# Patient Record
Sex: Female | Born: 1955
Health system: Southern US, Community
[De-identification: ages and names within clinical notes are randomized; demographics above are authoritative.]

## PROBLEM LIST (undated history)

## (undated) DIAGNOSIS — T7840XA Allergy, unspecified, initial encounter: Secondary | ICD-10-CM

## (undated) DIAGNOSIS — M79604 Pain in right leg: Secondary | ICD-10-CM

## (undated) DIAGNOSIS — E669 Obesity, unspecified: Secondary | ICD-10-CM

## (undated) DIAGNOSIS — M1990 Unspecified osteoarthritis, unspecified site: Secondary | ICD-10-CM

## (undated) DIAGNOSIS — E7800 Pure hypercholesterolemia, unspecified: Secondary | ICD-10-CM

## (undated) DIAGNOSIS — R079 Chest pain, unspecified: Secondary | ICD-10-CM

## (undated) DIAGNOSIS — G459 Transient cerebral ischemic attack, unspecified: Secondary | ICD-10-CM

## (undated) DIAGNOSIS — G4733 Obstructive sleep apnea (adult) (pediatric): Secondary | ICD-10-CM

## (undated) DIAGNOSIS — K589 Irritable bowel syndrome without diarrhea: Secondary | ICD-10-CM

## (undated) DIAGNOSIS — F32A Depression, unspecified: Secondary | ICD-10-CM

## (undated) DIAGNOSIS — R059 Cough, unspecified: Secondary | ICD-10-CM

## (undated) DIAGNOSIS — J301 Allergic rhinitis due to pollen: Secondary | ICD-10-CM

## (undated) DIAGNOSIS — K219 Gastro-esophageal reflux disease without esophagitis: Secondary | ICD-10-CM

## (undated) DIAGNOSIS — E785 Hyperlipidemia, unspecified: Secondary | ICD-10-CM

## (undated) HISTORY — DX: Allergy, unspecified, initial encounter: T78.40XA

## (undated) HISTORY — DX: Cough, unspecified: R05.9

## (undated) HISTORY — DX: Pain in right leg: M79.604

## (undated) HISTORY — DX: Obstructive sleep apnea (adult) (pediatric): G47.33

## (undated) HISTORY — DX: Chest pain, unspecified: R07.9

## (undated) HISTORY — DX: Unspecified osteoarthritis, unspecified site: M19.90

## (undated) HISTORY — DX: Transient cerebral ischemic attack, unspecified: G45.9

## (undated) HISTORY — DX: Pure hypercholesterolemia, unspecified: E78.00

## (undated) HISTORY — DX: Gastro-esophageal reflux disease without esophagitis: K21.9

## (undated) HISTORY — DX: Obesity, unspecified: E66.9

## (undated) HISTORY — PX: CHOLECYSTECTOMY: SHX55

## (undated) HISTORY — PX: FOOT SURGERY: SHX648

## (undated) HISTORY — DX: Irritable bowel syndrome, unspecified: K58.9

## (undated) HISTORY — DX: Hyperlipidemia, unspecified: E78.5

## (undated) HISTORY — PX: DILATION AND CURETTAGE OF UTERUS: SHX78

## (undated) HISTORY — DX: Depression, unspecified: F32.A

## (undated) HISTORY — DX: Allergic rhinitis due to pollen: J30.1

---

## 2000-02-26 ENCOUNTER — Other Ambulatory Visit: Admission: RE | Admit: 2000-02-26 | Discharge: 2000-02-26 | Payer: Self-pay | Admitting: Internal Medicine

## 2000-03-06 ENCOUNTER — Encounter: Admission: RE | Admit: 2000-03-06 | Discharge: 2000-03-06 | Payer: Self-pay | Admitting: Internal Medicine

## 2001-05-31 ENCOUNTER — Other Ambulatory Visit: Admission: RE | Admit: 2001-05-31 | Discharge: 2001-05-31 | Payer: Self-pay | Admitting: Internal Medicine

## 2002-07-12 ENCOUNTER — Encounter: Admission: RE | Admit: 2002-07-12 | Discharge: 2002-07-12 | Payer: Self-pay | Admitting: Internal Medicine

## 2002-07-12 ENCOUNTER — Encounter: Payer: Self-pay | Admitting: Internal Medicine

## 2003-01-31 ENCOUNTER — Encounter: Payer: Self-pay | Admitting: Internal Medicine

## 2003-01-31 ENCOUNTER — Encounter: Admission: RE | Admit: 2003-01-31 | Discharge: 2003-01-31 | Payer: Self-pay | Admitting: Internal Medicine

## 2005-03-25 ENCOUNTER — Other Ambulatory Visit: Admission: RE | Admit: 2005-03-25 | Discharge: 2005-03-25 | Payer: Self-pay | Admitting: Internal Medicine

## 2005-03-28 ENCOUNTER — Encounter: Admission: RE | Admit: 2005-03-28 | Discharge: 2005-03-28 | Payer: Self-pay | Admitting: Internal Medicine

## 2005-11-24 ENCOUNTER — Encounter: Admission: RE | Admit: 2005-11-24 | Discharge: 2005-11-24 | Payer: Self-pay | Admitting: Internal Medicine

## 2005-12-25 ENCOUNTER — Encounter (INDEPENDENT_AMBULATORY_CARE_PROVIDER_SITE_OTHER): Payer: Self-pay | Admitting: Specialist

## 2005-12-25 ENCOUNTER — Ambulatory Visit (HOSPITAL_COMMUNITY): Admission: RE | Admit: 2005-12-25 | Discharge: 2005-12-25 | Payer: Self-pay | Admitting: Surgery

## 2006-03-30 ENCOUNTER — Other Ambulatory Visit: Admission: RE | Admit: 2006-03-30 | Discharge: 2006-03-30 | Payer: Self-pay | Admitting: *Deleted

## 2008-09-20 ENCOUNTER — Other Ambulatory Visit: Admission: RE | Admit: 2008-09-20 | Discharge: 2008-09-20 | Payer: Self-pay | Admitting: Internal Medicine

## 2008-11-02 ENCOUNTER — Encounter (INDEPENDENT_AMBULATORY_CARE_PROVIDER_SITE_OTHER): Payer: Self-pay | Admitting: *Deleted

## 2008-11-02 ENCOUNTER — Ambulatory Visit (HOSPITAL_COMMUNITY): Admission: RE | Admit: 2008-11-02 | Discharge: 2008-11-02 | Payer: Self-pay | Admitting: *Deleted

## 2009-09-26 ENCOUNTER — Other Ambulatory Visit: Admission: RE | Admit: 2009-09-26 | Discharge: 2009-09-26 | Payer: Self-pay | Admitting: Internal Medicine

## 2009-10-01 ENCOUNTER — Encounter: Admission: RE | Admit: 2009-10-01 | Discharge: 2009-10-01 | Payer: Self-pay | Admitting: Internal Medicine

## 2010-10-15 NOTE — Op Note (Signed)
NAMEDAVETTE, NUGENT            ACCOUNT NO.:  [id number]   MEDICAL RECORD NO.:  [id number]          PATIENT TYPE:  AMB   LOCATION:  ENDO                         FACILITY:  [facility]   PHYSICIAN:  Georgiana Spinner, M.D.    DATE OF BIRTH:  [birth year]   DATE OF PROCEDURE:  11/02/2008  DATE OF DISCHARGE:                               OPERATIVE REPORT   PROCEDURE:  Upper endoscopy.   INDICATIONS:  Gastroesophageal reflux disease.   ANESTHESIA:  Fentanyl 75 mcg, Versed 7 mg.   PROCEDURE:  With the patient mildly sedated in the left lateral  decubitus position, the Pentax videoscopic endoscope was inserted in the  mouth, passed under direct vision through the esophagus -- which  appeared normal.  There was no evidence of Barrett's esophagus.  We  entered into the stomach; fundus, body, antrum, duodenal bulb, second  portion of duodenum were visualized.  From this point the endoscope was  slowly withdrawn, taking circumferential views of duodenal mucosa until  the endoscope had been pulled back into the stomach; placed in  retroflexion to view the stomach from below.  The endoscope was  straightened and withdrawn, taking circumferential views of the  remaining gastric and esophageal mucosa.  The patient's vital signs,  pulse oximetry remained stable.  The patient tolerated the procedure  well without apparent complication.   FINDINGS:  Unremarkable endoscopic examination.   PLAN:  Proceed to colonoscopy.           ______________________________  Georgiana Spinner, M.D.     GMO/MEDQ  D:  11/02/2008  T:  11/02/2008  Job:  [id number]

## 2010-10-15 NOTE — Op Note (Signed)
[name], [name]            ACCOUNT NO.:  [id number]   MEDICAL RECORD NO.:  [id number]          PATIENT TYPE:  AMB   LOCATION:  ENDO                         FACILITY:  [facility]   PHYSICIAN:  Georgiana Spinner, M.D.    DATE OF BIRTH:  [birth year]   DATE OF PROCEDURE:  11/02/2008  DATE OF DISCHARGE:                               OPERATIVE REPORT   PROCEDURE:  Colonoscopy.   INDICATIONS:  Colon cancer screening.   ANESTHESIA:  Fentanyl 50 mcg, Versed 3 mg.   PROCEDURE:  With the patient mildly sedated in the left lateral  decubitus position, the Pentax videoscopic pediatric colonoscope was  inserted in the rectum and passed under direct vision with pressure  applied, the patient turned to her back and subsequently back to her  left side.  We were able to reach the cecum, identified by ileocecal  valve and appendiceal orifice.  At the appendiceal orifice, there was a  question of whether this was adenomatous tissue, so I did take cold  biopsy of it.  My suspicion is it probably a normal variant, but once we  took biopsies, the endoscope was withdrawn, taking circumferential views  of colonic mucosa, stopping only in the rectum which appeared normal on  direct and showed hemorrhoids on retroflexed view.  The endoscope was  straightened and withdrawn.  The patient's vital signs and pulse  oximeter remained stable.  The patient tolerated the procedure well  without apparent complications.   FINDINGS:  Question of polyp at cecum, biopsied.  Await biopsy report.  The patient will call me for results and follow-up with me as an  outpatient.           ______________________________  Georgiana Spinner, M.D.     GMO/MEDQ  D:  11/02/2008  T:  11/02/2008  Job:  [id number]

## 2010-10-18 NOTE — Op Note (Signed)
Whitney [name], Whitney [name]            ACCOUNT NO.:  [id number]   MEDICAL RECORD NO.:  [id number]          PATIENT TYPE:  AMB   LOCATION:  DAY                          FACILITY:  [facility]   PHYSICIAN:  Currie Paris, M.D.DATE OF BIRTH:  10/27/1955   DATE OF PROCEDURE:  12/25/2005  DATE OF DISCHARGE:                                 OPERATIVE REPORT   CCS   PREOPERATIVE DIAGNOSIS:  Chronic calculus cholecystitis.   POSTOPERATIVE DIAGNOSIS:  Chronic calculus cholecystitis.   OPERATION:  Laparoscopic cholecystectomy with operative cholangiogram.   SURGEON:  Currie Paris, M.D.   ASSISTANT:  Sheppard Plumber. Earlene Plater, M.D.   ANESTHESIA:  General endotracheal.   CLINICAL HISTORY:  This is a 55 year old lady with gallbladder with multiple  stones and biliary type symptoms.  She elected to proceed to  cholecystectomy.   DESCRIPTION OF PROCEDURE:  The patient was seen in the holding area and she  had no further questions.  She confirmed that cholecystectomy was the  planned procedure.   The patient taken to the operating room after satisfactory general  anesthesia had been obtained, the abdomen was prepped and draped.  The time-  out occurred.   I used 0.25% plain Marcaine for each incision.  The umbilical incision was  made, the fascia opened, and the peritoneal cavity entered under direct  vision.  The Hasson was introduced and held with a pursestring.   The abdomen was insufflated to 15 and a 10/11 trocar was placed in the  epigastrium and two 5's laterally.   The area of the cystic duct was identified and opened, opening the  peritoneum and dissecting a long segment of cystic duct and identifying  clearly the cystic duct junction with the gallbladder and with the common  duct.  I saw what appeared to be anterior branch of the cystic artery as  well as what I thought was a posterior branch coming just behind the cystic  duct.   I put a single clip on the cystic duct at the  junction with the gallbladder  and a single clip what I thought was the posterior artery.  The cystic duct  was then opened and some bile milked out.  A Cook catheter was introduced  percutaneously and operative cholangiography done.  This showed a somewhat  plump common duct but good flow into the duodenum and filling of hepatic  radicals.   Cystic duct catheter was removed.  Three clips were placed on the stay side  of the cystic duct and it was divided.  What I thought was the anterior  branch of the cystic artery was dissected up as it was coming up on the  gallbladder, triple clipped and divided.  The presumed posterior segment of  the artery I thought looked fairly flimsy so as I cut it just on the  gallbladder side of the clip and we noticed a tiny leak of bile.  At first I  thought this might have represented an aberrant right hepatic duct.  I  therefore spent several minutes dissecting this out back towards the common  duct.  I could  see that this small tubular structure entered the common duct  just adjacent to the cystic duct.  Tracing it back towards the gallbladder,  I could free up the gallbladder even further up taking it off of the liver  and could see that this structure clearly entered into the gallbladder and  did not curve back and entering into the liver.  I took several photographs  to document the findings.  The accessory cystic duct was then clipped twice  at its junction with the common duct and divided.  The gallbladder was then  removed in the usual fashion from below to above.  It was placed in a bag.  I irrigated and made sure everything was dry.  We checked carefully the area  of the cystic duct and saw no evidence of bleeding or bile leaks or any  accessory ducts that might have been accidentally severed.  Once all this  was dry, I brought the gallbladder out the umbilical port.  We removed the  pursestring and closed the umbilical port with a figure 8-0  Vicryl.   Final check for hemostasis was made and everything was dry.  Lateral ports  removed.  The abdomen was deflated through the epigastric port and it was  removed.  Skin was closed with 4-0 Monocryl subcuticular plus Dermabond.   The patient tolerated the procedure well.  There no operative complications.  All counts were correct.      Currie Paris, M.D.  Electronically Signed     CJS/MEDQ  D:  12/25/2005  T:  12/25/2005  Job:  [id number]   cc:   Marcene Duos, M.D.  Fax: [phone]

## 2013-09-20 ENCOUNTER — Other Ambulatory Visit (HOSPITAL_COMMUNITY)
Admission: RE | Admit: 2013-09-20 | Discharge: 2013-09-20 | Disposition: A | Discharge status: Home / Self Care | Payer: 59 | Source: Ambulatory Visit | Attending: Internal Medicine | Admitting: Internal Medicine

## 2013-09-20 ENCOUNTER — Other Ambulatory Visit: Payer: Self-pay | Admitting: Internal Medicine

## 2013-09-20 DIAGNOSIS — Z01419 Encounter for gynecological examination (general) (routine) without abnormal findings: Secondary | ICD-10-CM | POA: Insufficient documentation

## 2013-09-27 ENCOUNTER — Ambulatory Visit (INDEPENDENT_AMBULATORY_CARE_PROVIDER_SITE_OTHER): Payer: 59

## 2013-09-27 ENCOUNTER — Encounter: Payer: Self-pay | Admitting: Podiatry

## 2013-09-27 ENCOUNTER — Ambulatory Visit (INDEPENDENT_AMBULATORY_CARE_PROVIDER_SITE_OTHER): Payer: 59 | Admitting: Podiatry

## 2013-09-27 ENCOUNTER — Telehealth: Payer: Self-pay | Admitting: *Deleted

## 2013-09-27 VITALS — BP 136/84 | HR 92 | Resp 16 | Ht 65.0 in | Wt 222.0 lb

## 2013-09-27 DIAGNOSIS — L608 Other nail disorders: Secondary | ICD-10-CM

## 2013-09-27 DIAGNOSIS — M722 Plantar fascial fibromatosis: Secondary | ICD-10-CM

## 2013-09-27 MED ORDER — METHYLPREDNISOLONE (PAK) 4 MG PO TABS: ORAL_TABLET | ORAL | Status: DC

## 2013-09-27 MED ORDER — MELOXICAM 15 MG PO TABS: 15.0000 mg | ORAL_TABLET | Freq: Every day | ORAL | Status: DC

## 2013-09-27 NOTE — Progress Notes (Signed)
   Subjective:    Patient ID: Whitney [name], female    DOB: May 16, 1956, 58 y.o.   MRN: [id number]  HPI Comments: Pain in the left heel, mainly the plantar heel,but also have a split tear in my achilles, its been going on for 2 months. Its a throb. No treatment   Foot Pain      Review of Systems  Hematological: Bruises/bleeds easily.  All other systems reviewed and are negative.      Objective:   Physical Exam: I have reviewed her past medical history medications allergies surgeries social history and review of systems. Pulses are strongly palpable bilateral lower extremity neurologic sensorium is intact per Semmes-Weinstein monofilament. He tendon reflexes are brisk and equal bilateral. Muscle strength +5 over 5 dorsiflexors plantar flexors inverters everters all intrinsic musculature is intact. Orthopedic evaluation demonstrates all joints distal to the ankle a full range of motion without crepitation. She does have pain on palpation medial continued tubercle of the left heel. Radiographic evaluation demonstrates a sharp plantar distally oriented calcaneal heel spur with soft tissue increase in density at the plantar fascial calcaneal insertion site. Cutaneous evaluation does demonstrate suspicious thick hallux nails bilateral.        Assessment & Plan:  Assessment: Plantar fasciitis with heel spur syndrome left heel. Dystrophic nails possibly mycotic hallux bilateral.  Plan: Injected the left heel today with Kenalog and local anesthetic. Sterapred Dosepak to be followed by noted. Dispensed a plantar fascial brace. She has a night splint at home that she will use. She will continue to ice the heel and tendo Achilles. We exam. Her nails to send for culture and no followup with her in 4 weeks.

## 2013-09-27 NOTE — Telephone Encounter (Signed)
[facility]  Mailed.

## 2013-09-27 NOTE — Patient Instructions (Signed)

## 2013-10-25 ENCOUNTER — Ambulatory Visit (INDEPENDENT_AMBULATORY_CARE_PROVIDER_SITE_OTHER): Payer: 59 | Admitting: Podiatry

## 2013-10-25 ENCOUNTER — Encounter: Payer: Self-pay | Admitting: Podiatry

## 2013-10-25 VITALS — BP 126/82 | HR 90 | Resp 16

## 2013-10-25 DIAGNOSIS — M722 Plantar fascial fibromatosis: Secondary | ICD-10-CM

## 2013-10-25 NOTE — Progress Notes (Signed)
She presents today for followup of plantar fasciitis to her left foot she states that she was doing quite well until just the other day when she was wearing her flip-flops and stepped on uneven ground in her yard this resulted in sharp pain to her heel and it has been sore ever since it seems to be better than it was at that point in time. Up until the point of reinjury she had been doing very well.  Objective: Pulses are palpable left foot. Pain on palpation medial calcaneal tubercle left foot. But much decrease in symptoms than previously noted.  Assessment: Plantar fasciitis left.  Plan: Reinjected her left heel today with Kenalog and local anesthetic. She will continue all other conservative therapies and we provided her with another night splint.

## 2013-11-03 ENCOUNTER — Telehealth: Payer: Self-pay | Admitting: *Deleted

## 2013-11-03 MED ORDER — NUVAIL EX SOLN: 1.0000 [drp] | Freq: Every day | CUTANEOUS | Status: DC

## 2013-11-03 NOTE — Telephone Encounter (Signed)
Dr Al Corpus states toenail fungal results were negative, and recommended [facility].  Left a message with Whitney [name] to have pt call for the result.

## 2013-11-18 ENCOUNTER — Encounter: Payer: Self-pay | Admitting: Podiatry

## 2013-12-06 ENCOUNTER — Encounter: Payer: Self-pay | Admitting: Podiatry

## 2013-12-06 ENCOUNTER — Ambulatory Visit (INDEPENDENT_AMBULATORY_CARE_PROVIDER_SITE_OTHER): Payer: 59 | Admitting: Podiatry

## 2013-12-06 VITALS — BP 147/78 | HR 91 | Resp 16

## 2013-12-06 DIAGNOSIS — M722 Plantar fascial fibromatosis: Secondary | ICD-10-CM

## 2013-12-06 NOTE — Progress Notes (Signed)
She presents today for followup of her plantar fasciitis. She states it does well when I do once those to do. She states that she's been doing very well and then recently she's been wearing flip flops and sandals and walking without her tennis shoes and she has not been wearing her night boot.  Objective: Vital signs are stable she is alert and oriented x3. She has mild tenderness on palpation medial calcaneal tubercle right heel.  Assessment: Plantar fasciitis well-healing.  Plan: Resolving plantar fasciitis right foot.  Continue all conservative therapies followup with me as needed.

## 2014-03-17 ENCOUNTER — Other Ambulatory Visit: Payer: Self-pay

## 2014-07-10 ENCOUNTER — Telehealth: Payer: Self-pay | Admitting: Interventional Cardiology

## 2014-07-10 NOTE — Telephone Encounter (Signed)
[facility] placed in chart prep Bin for 3.30.16 appt with Dr.Smith

## 2014-08-30 ENCOUNTER — Ambulatory Visit (INDEPENDENT_AMBULATORY_CARE_PROVIDER_SITE_OTHER): Payer: 59 | Admitting: Interventional Cardiology

## 2014-08-30 ENCOUNTER — Encounter: Payer: Self-pay | Admitting: Interventional Cardiology

## 2014-08-30 VITALS — BP 130/78 | HR 96 | Ht 64.0 in | Wt 235.0 lb

## 2014-08-30 DIAGNOSIS — K219 Gastro-esophageal reflux disease without esophagitis: Secondary | ICD-10-CM | POA: Insufficient documentation

## 2014-08-30 DIAGNOSIS — E785 Hyperlipidemia, unspecified: Secondary | ICD-10-CM

## 2014-08-30 DIAGNOSIS — R0789 Other chest pain: Secondary | ICD-10-CM | POA: Diagnosis not present

## 2014-08-30 DIAGNOSIS — G4733 Obstructive sleep apnea (adult) (pediatric): Secondary | ICD-10-CM | POA: Insufficient documentation

## 2014-08-30 NOTE — Patient Instructions (Signed)
Your physician recommends that you schedule a follow-up appointment as needed  Keep an active lifestyle

## 2014-08-30 NOTE — Progress Notes (Signed)
Cardiology Office Note   Date:  08/30/2014   ID:  Whitney [name], DOB April 25, 1956, MRN [id number]  PCP:  Pearson GrippeKIM, JAMES, MD  Cardiologist:   Lesleigh NoeSMITH III,HENRY W, MD   No chief complaint on file.     History of Present Illness: Whitney [name] is a 59 y.o. female who presents for evaluation of chest discomfort. She saw Dr. Selena BattenKim in January complaining of cough, chest soreness, and a dull precordial discomfort. H2 blocker therapy was started and the symptoms went completely away. She has decreased exertional tolerance because of sedentary lifestyle. She avoids long walking and stairs because of knee discomfort. She has no exertional discomfort. She feels back to baseline. She has had no recurrence of the discomfort since January. She denies palpitations.    Past Medical History  Diagnosis Date  . Allergy   . Arthritis   . TIA (transient ischemic attack)   . Hayfever   . Hyperlipidemia   . OSA (obstructive sleep apnea)   . GERD (gastroesophageal reflux disease)     Past Surgical History  Procedure Laterality Date  . Cholecystectomy    . Foot surgery    . Dilation and curettage of uterus       Current Outpatient Prescriptions  Medication Sig Dispense Refill  . aspirin 81 MG tablet Take 81 mg by mouth daily.    . Calcium Carb-Cholecalciferol (CALCIUM 600 + D PO) Take by mouth.    . cetirizine (ZYRTEC) 10 MG tablet Take 10 mg by mouth daily.    . Cholecalciferol (VITAMIN D3) 2000 UNITS TABS Take by mouth.    . Dermatological Products, Misc. (NUVAIL) SOLN Apply 1 drop topically daily. 1 Bottle prn  . fluticasone (VERAMYST) 27.5 MCG/SPRAY nasal spray Place 2 sprays into the nose daily.    Marland Kitchen. ibuprofen (ADVIL,MOTRIN) 600 MG tablet   0  . magnesium oxide (MAG-OX) 400 MG tablet Take 400 mg by mouth daily.    . Multiple Vitamin (MULTI VITAMIN DAILY PO) Take by mouth daily.     Marland Kitchen. omeprazole (PRILOSEC) 20 MG capsule   6   No current facility-administered medications for this  visit.    Allergies:   Review of patient's allergies indicates no known allergies.    Social History:  The patient  reports that she has never smoked. She has never used smokeless tobacco. She reports that she does not drink alcohol or use illicit drugs.   Family History:  The patient's family history includes Alzheimer's disease in her mother; Cerebral palsy in her daughter; Cirrhosis in her father; Diabetes type II in her father; Hyperlipidemia in her father.    ROS:  Please see the history of present illness.   Otherwise, review of systems are positive for bilateral knee discomfort. There are no palpitations. Increased weight..   All other systems are reviewed and negative.    PHYSICAL EXAM: VS:  BP 130/78 mmHg  Pulse 96  Ht 5\' 4"  (1.626 m)  Wt 235 lb (106.595 kg)  BMI 40.32 kg/m2 , BMI Body mass index is 40.32 kg/(m^2). GEN: Well nourished, well developed, in no acute distress HEENT: normal Neck: no JVD, carotid bruits, or masses Cardiac: RRR; no murmurs, rubs, or gallops,no edema  Respiratory:  clear to auscultation bilaterally, normal work of breathing GI: soft, nontender, nondistended, + BS MS: no deformity or atrophy Skin: warm and dry, no rash Neuro:  Strength and sensation are intact Psych: euthymic mood, full affect   EKG:  EKG is  not ordered today. The ekg recently performed by Dr. Selena Batten in January 2016 is normal.   Recent Labs: No results found for requested labs within last 365 days.    Lipid Panel No results found for: CHOL, TRIG, HDL, CHOLHDL, VLDL, LDLCALC, LDLDIRECT    Wt Readings from Last 3 Encounters:  08/30/14 235 lb (106.595 kg)  09/27/13 222 lb (100.699 kg)      Other studies Reviewed: Additional studies/ records that were reviewed today include: . Review of the above records demonstrates: Reviewed data from Dr. Selena Batten. She has a history of diet controlled hyperlipidemia. Obstructive sleep apnea not requiring C Pap, gastroesophageal reflux  disease, and hayfever.   ASSESSMENT AND PLAN:  Chest pain: Resolved with H2 blocker therapy  Hyperlipidemia, diet controlled  Gastroesophageal reflux, asymptomatic on H2 blocker therapy  Obstructive Sleep Apnea, stable and not requiring C Pap   Current medicines are reviewed at length with the patient today.  The patient does not have concerns regarding medicines.  The following changes have been made:  no change. We decided on clinical observation. Should symptoms recur, ischemic evaluation will be done at that time. The patient was not in favor of doing anything at this time.  Labs/ tests ordered today include: None needed.  No orders of the defined types were placed in this encounter.     Disposition:   FU with Mendel Ryder  as needed    Signed, Lesleigh Noe, MD  08/30/2014 2:45 PM    [facility] [address] [place], [place], [place]  [address] Phone: [phone]; Fax: [phone]

## 2015-05-22 ENCOUNTER — Ambulatory Visit (INDEPENDENT_AMBULATORY_CARE_PROVIDER_SITE_OTHER): Payer: 59

## 2015-05-22 ENCOUNTER — Encounter: Payer: Self-pay | Admitting: Podiatry

## 2015-05-22 ENCOUNTER — Ambulatory Visit (INDEPENDENT_AMBULATORY_CARE_PROVIDER_SITE_OTHER): Payer: 59 | Admitting: Podiatry

## 2015-05-22 VITALS — BP 121/80 | HR 83 | Resp 16

## 2015-05-22 DIAGNOSIS — M79671 Pain in right foot: Secondary | ICD-10-CM

## 2015-05-22 DIAGNOSIS — M79672 Pain in left foot: Secondary | ICD-10-CM | POA: Diagnosis not present

## 2015-05-22 DIAGNOSIS — M722 Plantar fascial fibromatosis: Secondary | ICD-10-CM | POA: Diagnosis not present

## 2015-05-22 NOTE — Progress Notes (Signed)
She presents today requesting new orthotics. She states that she still has some heel pain occasionally pathology the majority of her discomfort is noted to be to the lateral aspect of the bilateral foot left greater than right.   Objective: Vital signs are stable she is alert and oriented 3. Pulses are palpable. She has pain on palpation overlying the fifth metatarsal base left with what appears to be an overlying ganglion cyst or bursitis.   Assessment: plantar fasciitis with lateral compensatory syndrome bilateral. Ganglion cyst dorsolateral aspect left foot.  Plan: she was scanned for a new set of orthotics.

## 2015-06-15 ENCOUNTER — Ambulatory Visit: Payer: 59 | Admitting: *Deleted

## 2015-06-15 DIAGNOSIS — M79671 Pain in right foot: Secondary | ICD-10-CM

## 2015-06-15 DIAGNOSIS — M79672 Pain in left foot: Principal | ICD-10-CM

## 2015-06-15 NOTE — Patient Instructions (Signed)

## 2015-06-15 NOTE — Progress Notes (Signed)
Patient ID: Whitney ApleyCynthia L Alen, female   DOB: 1956-04-10, 60 y.o.   MRN: [id number] Patient presents for orthotic pick up.  Verbal and written break in and wear instructions given.  Patient will follow up in 4 weeks if symptoms worsen or fail to improve.

## 2016-07-08 ENCOUNTER — Ambulatory Visit (INDEPENDENT_AMBULATORY_CARE_PROVIDER_SITE_OTHER): Payer: 59 | Admitting: Podiatry

## 2016-07-08 ENCOUNTER — Ambulatory Visit (INDEPENDENT_AMBULATORY_CARE_PROVIDER_SITE_OTHER): Payer: 59

## 2016-07-08 VITALS — BP 129/68 | HR 79 | Resp 16

## 2016-07-08 DIAGNOSIS — M7661 Achilles tendinitis, right leg: Secondary | ICD-10-CM

## 2016-07-08 DIAGNOSIS — M7731 Calcaneal spur, right foot: Secondary | ICD-10-CM

## 2016-07-08 MED ORDER — METHYLPREDNISOLONE 4 MG PO TBPK
ORAL_TABLET | ORAL | 0 refills | Status: DC
Start: 2016-07-08 — End: 2016-10-02

## 2016-07-08 NOTE — Patient Instructions (Signed)

## 2016-07-09 NOTE — Progress Notes (Signed)
She presents today to complain of pain to the back of the right heel for the past 2 months. Denies any trauma.  Objective: Vital signs are stable alert and oriented 3. Pulses are palpable. She has tenderness on palpation of the Achilles of the right heel at the insertion site the posterior aspect of the right calcaneus. Radiographs taken today demonstrate significant swelling of the insertional Achilles and a very large posterior proximally oriented calcaneal heel spur. No fractures are identified.  Assessment: Insertional Achilles tendinitis and bursitis.  Plan: I injected the bursitis today with 2 mg of dexamethasone making sure not to inject into the tendon this was only subcutaneously. I also put her on a Medrol Dosepak to be followed by meloxicam placed her in a night splint and Cam Walker. I will follow-up with her in 1 month. If she is not improved and MRI will be necessary.

## 2016-08-05 ENCOUNTER — Encounter: Payer: Self-pay | Admitting: Podiatry

## 2016-08-05 ENCOUNTER — Ambulatory Visit (INDEPENDENT_AMBULATORY_CARE_PROVIDER_SITE_OTHER): Payer: 59 | Admitting: Podiatry

## 2016-08-05 DIAGNOSIS — M7661 Achilles tendinitis, right leg: Secondary | ICD-10-CM | POA: Diagnosis not present

## 2016-08-06 NOTE — Progress Notes (Signed)
She presents today for follow-up of her right Achilles tendon. 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 She states that I just couldn't do the work in the [employer].  Objective: Vital signs are stable she is alert and oriented 3 she has moderate to severe tenderness on palpation of the Achilles tendon at the superior margin of the calcaneus posteriorly. There is some bogginess in the area is more than likely retrocalcaneal bursa.  Assessment: Bursitis Achilles tendinitis.  Plan: I injected 2 mg of dexamethasone into the bursa just anterior and to the Achilles and superior to the calcaneus. This should help alleviate her symptoms. I recommended that she get back into her Cam Dan HumphreysWalker if this does not work for her and MRI is our next. She understands this and is amenable to it after our discussion today.

## 2016-08-28 DIAGNOSIS — H04123 Dry eye syndrome of bilateral lacrimal glands: Secondary | ICD-10-CM | POA: Diagnosis not present

## 2016-08-28 DIAGNOSIS — H40033 Anatomical narrow angle, bilateral: Secondary | ICD-10-CM | POA: Diagnosis not present

## 2016-09-09 ENCOUNTER — Encounter: Payer: Self-pay | Admitting: Podiatry

## 2016-09-09 ENCOUNTER — Ambulatory Visit (INDEPENDENT_AMBULATORY_CARE_PROVIDER_SITE_OTHER): Payer: 59 | Admitting: Podiatry

## 2016-09-09 DIAGNOSIS — M7661 Achilles tendinitis, right leg: Secondary | ICD-10-CM | POA: Diagnosis not present

## 2016-09-09 NOTE — Progress Notes (Signed)
She presents today for follow-up of her Achilles tendinitis of the right foot. She states it is still very sore and even wearing the Cam Walker no longer alleviates her symptoms.  Objective: Vital signs are stable she is alert and oriented 3. Pulses are palpable. She has no calf pain. She has pain on direct palpation of the Achilles tendon at the superior margin of the calcaneus.  She's had surgery to her left calcaneus gastroc recession and Achilles tendon lysis previously.  Assessment: Highly concerned for insertional tear of the Achilles tendon. Distally has gastroc equinus and retrocalcaneal heel spur present.  Plan: Discussed etiology pathology conservative versus surgical therapy as requested an MRI today to confirm suspicion of tear and for surgical evaluation.

## 2016-09-10 ENCOUNTER — Telehealth: Payer: Self-pay | Admitting: *Deleted

## 2016-09-10 DIAGNOSIS — T148XXA Other injury of unspecified body region, initial encounter: Secondary | ICD-10-CM

## 2016-09-10 NOTE — Telephone Encounter (Addendum)
-----   Message from Kristian Covey, [facility] sent at 09/09/2016  4:34 PM EDT ----- Regarding: MRI MRI of heel and ankle right for achilles tendon tear. 09/10/2016-Orders given to D. Meadows and faxed [employer].

## 2016-09-18 ENCOUNTER — Telehealth: Payer: Self-pay | Admitting: *Deleted

## 2016-09-18 NOTE — Telephone Encounter (Signed)
"  Her MRI right ankle without contrast is scheduled for Sunday.  I wanted to make sure you were aware of it.  She has [facility]."  I will take care of it.

## 2016-09-19 ENCOUNTER — Telehealth: Payer: Self-pay | Admitting: *Deleted

## 2016-09-19 NOTE — Telephone Encounter (Signed)
I called and informed Dondra Spry that I got authorization for foot but I could not get it for the ankle.  I advised her to continue with just the foot.  She stated the tech said they would not be able to rule out a tear with a foot MRI.  I informed her that a peer to peer is required by the physician.  So, patient's MRI may need to be rescheduled because Dr. Al Corpus is not in the office today.  She stated she would let Alvino Chapel know to give her a call.  I attempted to call and give further information to Ms. Madison, Nurse Intake, but was unsuccessful at getting it authorized.  She asked that the physician call [phone] and ask for a peer to peer.  The case number is [id number].

## 2016-09-21 ENCOUNTER — Other Ambulatory Visit: Payer: 59

## 2016-09-22 NOTE — Telephone Encounter (Signed)
I called and informed Alvino Chapel at [facility] that Dr. Al Corpus did the peer to peer and the MRI of the ankle was authorized.  Authorization number is [id number] and it expries on 11/06/2016.

## 2016-09-22 NOTE — Telephone Encounter (Signed)
Ankle # is [phone] Expires on 11/06/2016

## 2016-09-29 ENCOUNTER — Ambulatory Visit
Admission: RE | Admit: 2016-09-29 | Discharge: 2016-09-29 | Disposition: A | Discharge status: Home / Self Care | Payer: 59 | Source: Ambulatory Visit | Attending: Podiatry | Admitting: Podiatry

## 2016-09-29 DIAGNOSIS — M7661 Achilles tendinitis, right leg: Secondary | ICD-10-CM | POA: Diagnosis not present

## 2016-10-02 ENCOUNTER — Ambulatory Visit (INDEPENDENT_AMBULATORY_CARE_PROVIDER_SITE_OTHER): Payer: 59 | Admitting: Podiatry

## 2016-10-02 ENCOUNTER — Encounter: Payer: Self-pay | Admitting: Podiatry

## 2016-10-02 DIAGNOSIS — M7661 Achilles tendinitis, right leg: Secondary | ICD-10-CM | POA: Diagnosis not present

## 2016-10-02 NOTE — Progress Notes (Signed)
She presents today for her MRI results. She states that her heel feels a little bit better.  Objective: Vital signs are stable she is alert and oriented 3. Pulses are palpable. We discussed the MRI today which does demonstrate interstitial fraying or tearing of the Achilles tendon at its posterior inferior aspect of the attachment on the calcaneus.  Assessment: Achilles tendinosis and tendinopathy with interstitial tearing.  Plan: I highly recommended physical therapy at this point in lieu of surgery. If physical therapy does not work or sustain her symptomatology then surgery will be necessary.

## 2016-10-07 DIAGNOSIS — M25571 Pain in right ankle and joints of right foot: Secondary | ICD-10-CM | POA: Diagnosis not present

## 2016-10-07 DIAGNOSIS — M25471 Effusion, right ankle: Secondary | ICD-10-CM | POA: Diagnosis not present

## 2016-10-07 DIAGNOSIS — M25671 Stiffness of right ankle, not elsewhere classified: Secondary | ICD-10-CM | POA: Diagnosis not present

## 2016-10-28 ENCOUNTER — Encounter: Payer: Self-pay | Admitting: Podiatry

## 2016-10-28 ENCOUNTER — Ambulatory Visit (INDEPENDENT_AMBULATORY_CARE_PROVIDER_SITE_OTHER): Payer: 59 | Admitting: Podiatry

## 2016-10-28 DIAGNOSIS — M7661 Achilles tendinitis, right leg: Secondary | ICD-10-CM

## 2016-10-28 NOTE — Progress Notes (Signed)
She presents today for follow-up of her Achilles tendinitis right. She states that she has not been to physical therapy yet since they have not resorted her insurance. She states that she is really not doing any better but no worse.  Objective: Vital signs are stable she is alert and oriented 3 still has severe pain on palpation of the Achilles tendon at its insertion site of the right heel. No lesions or wounds are noted.  Assessment: Achilles tendinitis insertional in nature right interstitial tearing is noted on MRI.  Plan: I encouraged her to follow up with physical therapy initially also did discuss with her the longer she leaves this more likely is to need surgical intervention.

## 2016-10-31 DIAGNOSIS — M25671 Stiffness of right ankle, not elsewhere classified: Secondary | ICD-10-CM | POA: Diagnosis not present

## 2016-10-31 DIAGNOSIS — M25471 Effusion, right ankle: Secondary | ICD-10-CM | POA: Diagnosis not present

## 2016-10-31 DIAGNOSIS — M25571 Pain in right ankle and joints of right foot: Secondary | ICD-10-CM | POA: Diagnosis not present

## 2016-11-05 DIAGNOSIS — M25471 Effusion, right ankle: Secondary | ICD-10-CM | POA: Diagnosis not present

## 2016-11-05 DIAGNOSIS — M25671 Stiffness of right ankle, not elsewhere classified: Secondary | ICD-10-CM | POA: Diagnosis not present

## 2016-11-05 DIAGNOSIS — M25571 Pain in right ankle and joints of right foot: Secondary | ICD-10-CM | POA: Diagnosis not present

## 2016-11-07 DIAGNOSIS — M25471 Effusion, right ankle: Secondary | ICD-10-CM | POA: Diagnosis not present

## 2016-11-07 DIAGNOSIS — M25671 Stiffness of right ankle, not elsewhere classified: Secondary | ICD-10-CM | POA: Diagnosis not present

## 2016-11-07 DIAGNOSIS — M25571 Pain in right ankle and joints of right foot: Secondary | ICD-10-CM | POA: Diagnosis not present

## 2016-11-11 DIAGNOSIS — M25571 Pain in right ankle and joints of right foot: Secondary | ICD-10-CM | POA: Diagnosis not present

## 2016-11-11 DIAGNOSIS — M25471 Effusion, right ankle: Secondary | ICD-10-CM | POA: Diagnosis not present

## 2016-11-11 DIAGNOSIS — M25671 Stiffness of right ankle, not elsewhere classified: Secondary | ICD-10-CM | POA: Diagnosis not present

## 2016-11-14 DIAGNOSIS — M25471 Effusion, right ankle: Secondary | ICD-10-CM | POA: Diagnosis not present

## 2016-11-14 DIAGNOSIS — M25671 Stiffness of right ankle, not elsewhere classified: Secondary | ICD-10-CM | POA: Diagnosis not present

## 2016-11-14 DIAGNOSIS — M25571 Pain in right ankle and joints of right foot: Secondary | ICD-10-CM | POA: Diagnosis not present

## 2016-11-18 DIAGNOSIS — M25671 Stiffness of right ankle, not elsewhere classified: Secondary | ICD-10-CM | POA: Diagnosis not present

## 2016-11-18 DIAGNOSIS — M25471 Effusion, right ankle: Secondary | ICD-10-CM | POA: Diagnosis not present

## 2016-11-18 DIAGNOSIS — M25571 Pain in right ankle and joints of right foot: Secondary | ICD-10-CM | POA: Diagnosis not present

## 2016-11-28 DIAGNOSIS — M25571 Pain in right ankle and joints of right foot: Secondary | ICD-10-CM | POA: Diagnosis not present

## 2016-11-28 DIAGNOSIS — M25471 Effusion, right ankle: Secondary | ICD-10-CM | POA: Diagnosis not present

## 2016-11-28 DIAGNOSIS — M25671 Stiffness of right ankle, not elsewhere classified: Secondary | ICD-10-CM | POA: Diagnosis not present

## 2016-12-02 DIAGNOSIS — M25471 Effusion, right ankle: Secondary | ICD-10-CM | POA: Diagnosis not present

## 2016-12-02 DIAGNOSIS — M25571 Pain in right ankle and joints of right foot: Secondary | ICD-10-CM | POA: Diagnosis not present

## 2016-12-02 DIAGNOSIS — M25671 Stiffness of right ankle, not elsewhere classified: Secondary | ICD-10-CM | POA: Diagnosis not present

## 2016-12-04 DIAGNOSIS — M25671 Stiffness of right ankle, not elsewhere classified: Secondary | ICD-10-CM | POA: Diagnosis not present

## 2016-12-04 DIAGNOSIS — M25571 Pain in right ankle and joints of right foot: Secondary | ICD-10-CM | POA: Diagnosis not present

## 2016-12-04 DIAGNOSIS — M25471 Effusion, right ankle: Secondary | ICD-10-CM | POA: Diagnosis not present

## 2016-12-16 ENCOUNTER — Ambulatory Visit (INDEPENDENT_AMBULATORY_CARE_PROVIDER_SITE_OTHER): Payer: 59 | Admitting: Podiatry

## 2016-12-16 ENCOUNTER — Encounter: Payer: Self-pay | Admitting: Podiatry

## 2016-12-16 DIAGNOSIS — M7661 Achilles tendinitis, right leg: Secondary | ICD-10-CM

## 2016-12-16 NOTE — Progress Notes (Signed)
She presents today for follow-up of her Achilles tendinitis. She presents in good spirits. She states that the right heel feels approximately 80-85% better but not 100% yet. She states that Sunday she has no pain at all and other days it is considerably painful.  Objective: Vital signs are stable she is alert and oriented 3 pulses remain palpable to the right leg and foot with some tenderness on palpation of the posterior aspect of the heel at the calcaneal Achilles insertion site. There appears to be some bursitis present. It is not warm to the touch does appear to be fluctuant.  Assessment: Insertional Achilles tendinitis per MRI with bursitis.  Plan: I offered her an injection today which she declined. I will follow-up with her as needed.

## 2017-02-24 DIAGNOSIS — Z0000 Encounter for general adult medical examination without abnormal findings: Secondary | ICD-10-CM | POA: Diagnosis not present

## 2017-03-03 DIAGNOSIS — Z01419 Encounter for gynecological examination (general) (routine) without abnormal findings: Secondary | ICD-10-CM | POA: Diagnosis not present

## 2017-03-03 DIAGNOSIS — Z1212 Encounter for screening for malignant neoplasm of rectum: Secondary | ICD-10-CM | POA: Diagnosis not present

## 2017-03-03 DIAGNOSIS — Z0000 Encounter for general adult medical examination without abnormal findings: Secondary | ICD-10-CM | POA: Diagnosis not present

## 2017-03-16 DIAGNOSIS — Z1231 Encounter for screening mammogram for malignant neoplasm of breast: Secondary | ICD-10-CM | POA: Diagnosis not present

## 2017-03-30 DIAGNOSIS — R05 Cough: Secondary | ICD-10-CM | POA: Diagnosis not present

## 2017-03-30 DIAGNOSIS — J300 Vasomotor rhinitis: Secondary | ICD-10-CM | POA: Diagnosis not present

## 2017-03-30 DIAGNOSIS — J3089 Other allergic rhinitis: Secondary | ICD-10-CM | POA: Diagnosis not present

## 2017-08-12 DIAGNOSIS — M25561 Pain in right knee: Secondary | ICD-10-CM | POA: Diagnosis not present

## 2017-08-24 DIAGNOSIS — M25561 Pain in right knee: Secondary | ICD-10-CM | POA: Diagnosis not present

## 2017-09-19 DIAGNOSIS — H04123 Dry eye syndrome of bilateral lacrimal glands: Secondary | ICD-10-CM | POA: Diagnosis not present

## 2017-09-19 DIAGNOSIS — H40033 Anatomical narrow angle, bilateral: Secondary | ICD-10-CM | POA: Diagnosis not present

## 2017-12-29 DIAGNOSIS — R05 Cough: Secondary | ICD-10-CM | POA: Diagnosis not present

## 2017-12-29 DIAGNOSIS — J300 Vasomotor rhinitis: Secondary | ICD-10-CM | POA: Diagnosis not present

## 2017-12-29 DIAGNOSIS — J3089 Other allergic rhinitis: Secondary | ICD-10-CM | POA: Diagnosis not present

## 2018-03-23 DIAGNOSIS — Z0000 Encounter for general adult medical examination without abnormal findings: Secondary | ICD-10-CM | POA: Diagnosis not present

## 2018-10-20 ENCOUNTER — Emergency Department (HOSPITAL_BASED_OUTPATIENT_CLINIC_OR_DEPARTMENT_OTHER)
Admission: EM | Admit: 2018-10-20 | Discharge: 2018-10-20 | Disposition: A | Discharge status: Home / Self Care | Payer: 59 | Attending: Emergency Medicine | Admitting: Emergency Medicine

## 2018-10-20 ENCOUNTER — Emergency Department (HOSPITAL_BASED_OUTPATIENT_CLINIC_OR_DEPARTMENT_OTHER): Payer: 59

## 2018-10-20 ENCOUNTER — Other Ambulatory Visit: Payer: Self-pay

## 2018-10-20 ENCOUNTER — Encounter (HOSPITAL_BASED_OUTPATIENT_CLINIC_OR_DEPARTMENT_OTHER): Payer: Self-pay

## 2018-10-20 DIAGNOSIS — Z7982 Long term (current) use of aspirin: Secondary | ICD-10-CM | POA: Diagnosis not present

## 2018-10-20 DIAGNOSIS — Z79899 Other long term (current) drug therapy: Secondary | ICD-10-CM | POA: Insufficient documentation

## 2018-10-20 DIAGNOSIS — Z8673 Personal history of transient ischemic attack (TIA), and cerebral infarction without residual deficits: Secondary | ICD-10-CM | POA: Diagnosis not present

## 2018-10-20 DIAGNOSIS — Y92410 Unspecified street and highway as the place of occurrence of the external cause: Secondary | ICD-10-CM | POA: Diagnosis not present

## 2018-10-20 DIAGNOSIS — R52 Pain, unspecified: Secondary | ICD-10-CM

## 2018-10-20 DIAGNOSIS — M25531 Pain in right wrist: Secondary | ICD-10-CM

## 2018-10-20 DIAGNOSIS — Y999 Unspecified external cause status: Secondary | ICD-10-CM | POA: Insufficient documentation

## 2018-10-20 DIAGNOSIS — S5011XA Contusion of right forearm, initial encounter: Secondary | ICD-10-CM

## 2018-10-20 DIAGNOSIS — Y9389 Activity, other specified: Secondary | ICD-10-CM | POA: Insufficient documentation

## 2018-10-20 DIAGNOSIS — S6991XA Unspecified injury of right wrist, hand and finger(s), initial encounter: Secondary | ICD-10-CM | POA: Diagnosis present

## 2018-10-20 DIAGNOSIS — Z23 Encounter for immunization: Secondary | ICD-10-CM | POA: Insufficient documentation

## 2018-10-20 MED ORDER — TETANUS-DIPHTH-ACELL PERTUSSIS 5-2.5-18.5 LF-MCG/0.5 IM SUSP
0.5000 mL | Freq: Once | INTRAMUSCULAR | Status: AC
  Administered 2018-10-20: 23:00:00 0.5 mL via INTRAMUSCULAR
  Filled 2018-10-20: qty 0.5

## 2018-10-20 NOTE — ED Notes (Signed)
Pt returned from xray

## 2018-10-20 NOTE — ED Notes (Signed)
ED Provider at bedside. 
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
## 2018-10-20 NOTE — ED Notes (Signed)
Pt verbalizes understanding of d/c instructions and denies any further needs at this time. 
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
## 2019-02-15 ENCOUNTER — Ambulatory Visit (INDEPENDENT_AMBULATORY_CARE_PROVIDER_SITE_OTHER): Payer: 59

## 2019-02-15 ENCOUNTER — Encounter: Payer: Self-pay | Admitting: Podiatry

## 2019-02-15 ENCOUNTER — Ambulatory Visit (INDEPENDENT_AMBULATORY_CARE_PROVIDER_SITE_OTHER): Payer: 59 | Admitting: Podiatry

## 2019-02-15 ENCOUNTER — Other Ambulatory Visit: Payer: Self-pay

## 2019-02-15 DIAGNOSIS — M7661 Achilles tendinitis, right leg: Secondary | ICD-10-CM

## 2019-02-15 MED ORDER — METHYLPREDNISOLONE 4 MG PO TBPK: ORAL_TABLET | ORAL | 0 refills | Status: DC
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
## 2019-03-29 ENCOUNTER — Ambulatory Visit (INDEPENDENT_AMBULATORY_CARE_PROVIDER_SITE_OTHER): Payer: 59 | Admitting: Podiatry

## 2019-03-29 ENCOUNTER — Other Ambulatory Visit: Payer: Self-pay

## 2019-03-29 DIAGNOSIS — M7661 Achilles tendinitis, right leg: Secondary | ICD-10-CM | POA: Diagnosis not present

## 2019-03-30 NOTE — Progress Notes (Signed)
She presents today states that her Achilles tendinitis of her left foot has gotten much better since last visit.  The pain has decreased but is still present at times she states that today's pain level is about a 3 out of 10 but she was walking from the lobby to the room which was a long walk.  She states that she stopped wearing the night splint and the other boot about a week ago.  She presents today in her tennis shoes.  Objective: Vital signs are stable she is alert and oriented x3.  Pulses are palpable.  Much decrease in pain on palpation of the posterior aspect of the Achilles at its insertion site left.  States that is doing very well.  There is no new warmth in the area.  Appears to be resolving much better than I had expected.  Assessment: Resolving insertional Achilles tendinitis left.  Plan: Should this recur MRI will be necessary to consider surgical intervention.

## 2019-03-31 IMAGING — MR MR ANKLE*R* W/O CM
3 of 5 series · 8 of 40 positions shown · non-contrast
Comparison: 07/08/2016 radiographs

CLINICAL DATA: Right Achilles pain since May 2016.

EXAM:
MRI OF THE RIGHT ANKLE WITHOUT CONTRAST
TECHNIQUE: Multiplanar, multisequence MR imaging of the ankle was performed. No
intravenous contrast was administered.

[Series 3: T1 · sagittal · right · 2.5mm · 0.18mm/px · 2 of 29 slices shown]
[im 5/29]
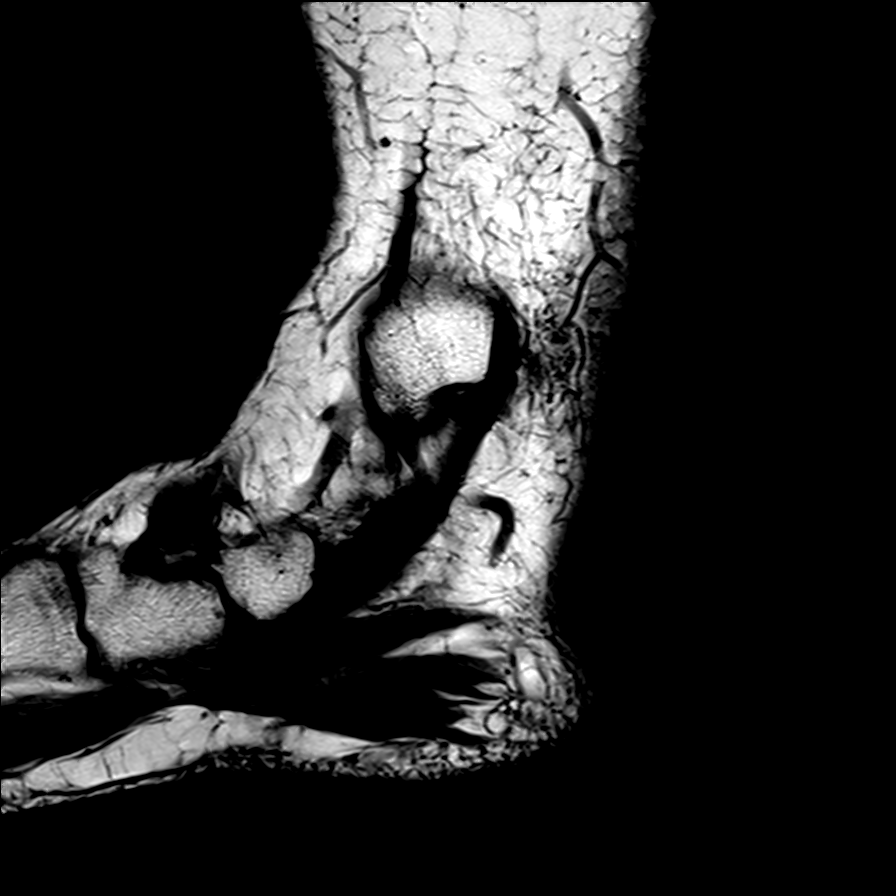
[im 15/29]
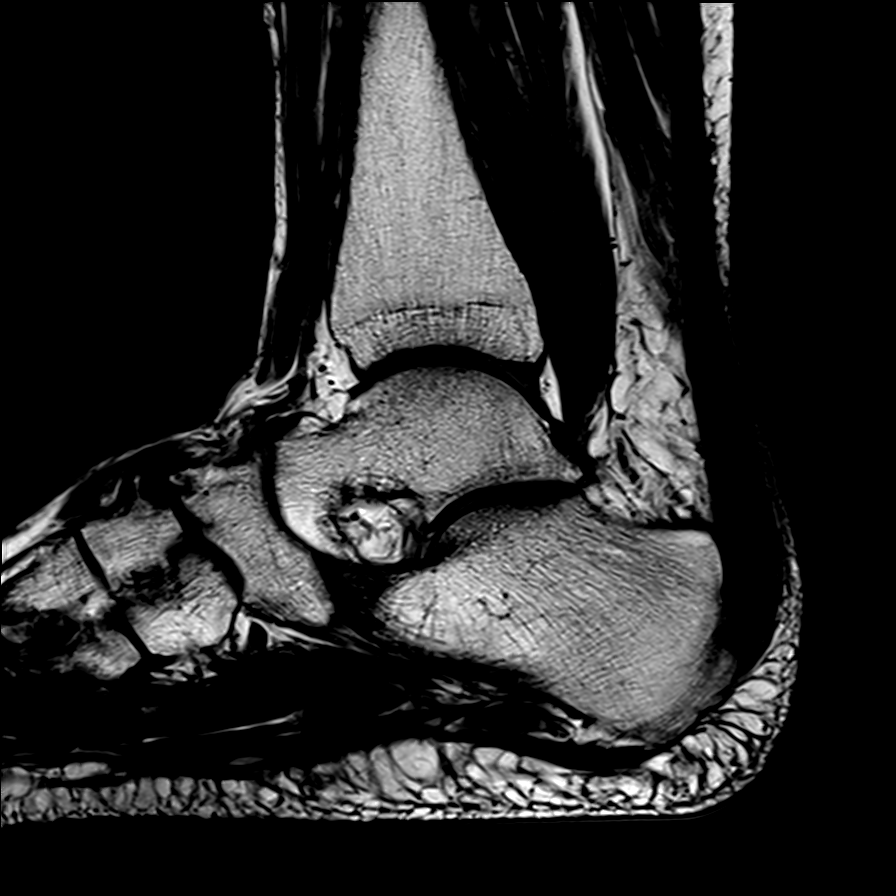

[Series 4: T2 fat-sat · axial · right · 3.5mm · 0.20mm/px · z∈[-82,+10]mm · 3 of 34 slices shown (1 of 2)]
[im 5/34]
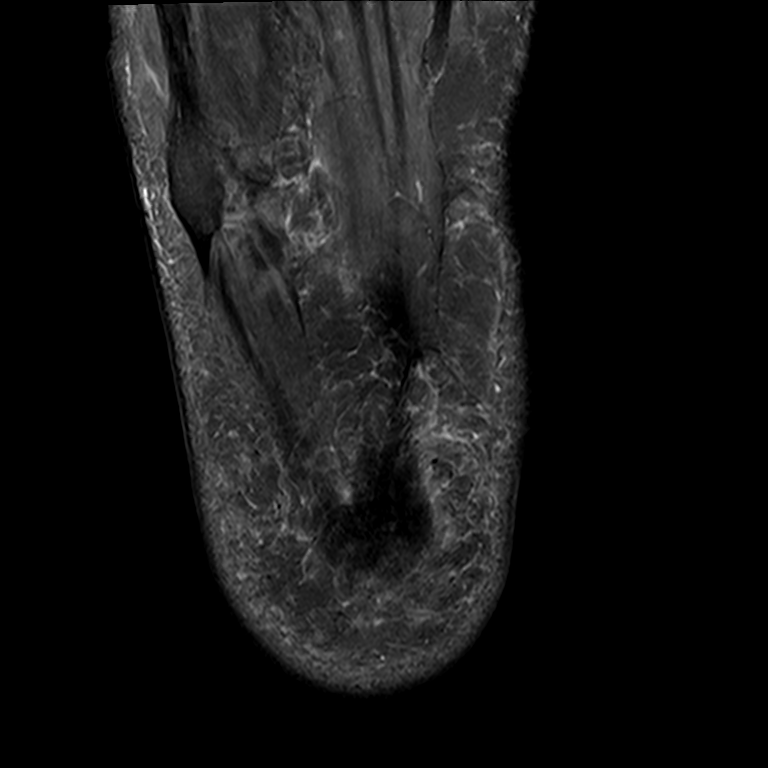
[im 17/34]
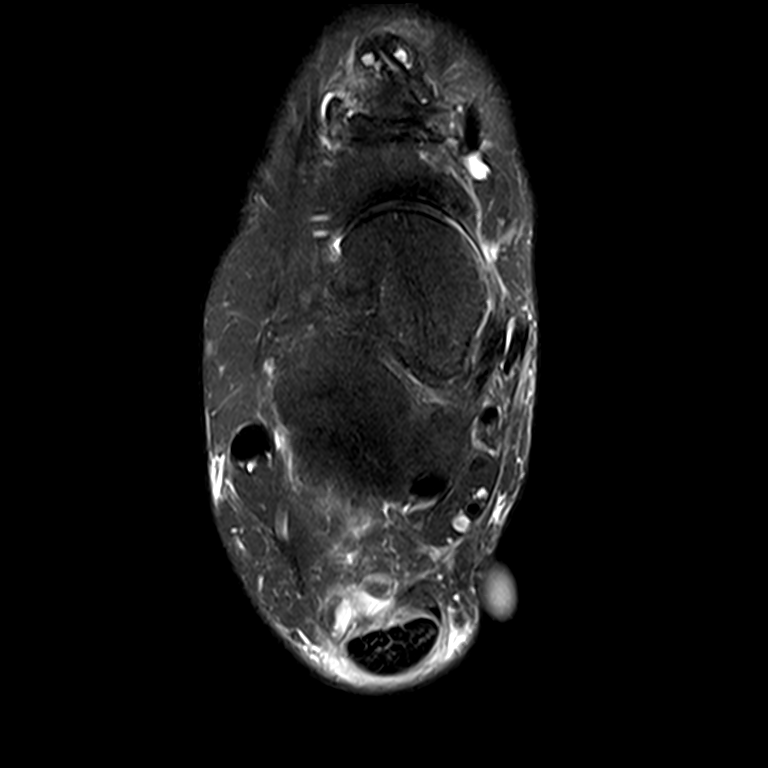
[im 29/34]
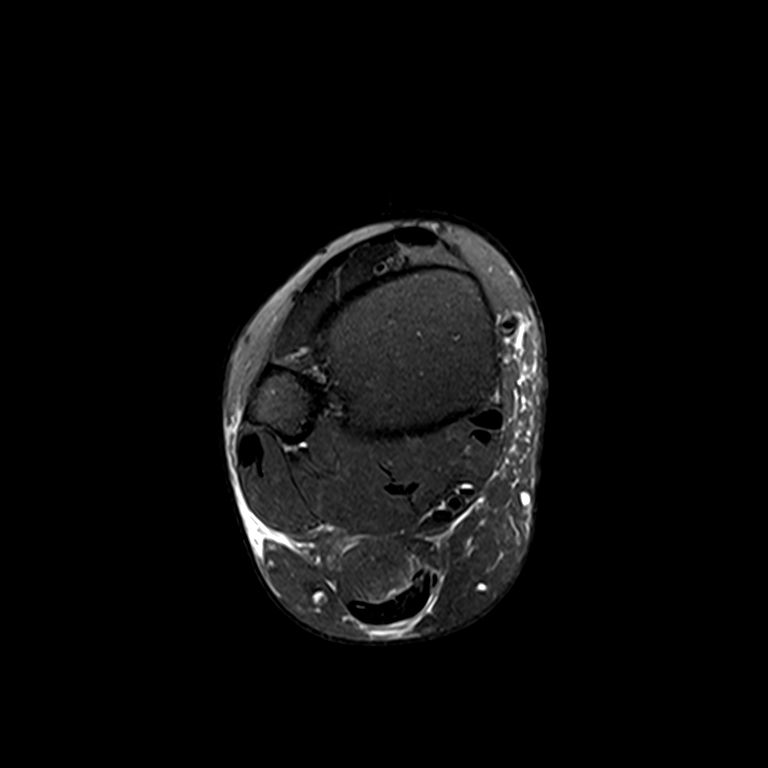

[Series 7: T2 fat-sat · coronal · right · 3.5mm · 0.21mm/px · 3 of 33 slices shown (2 of 2)]
[im 5/33]
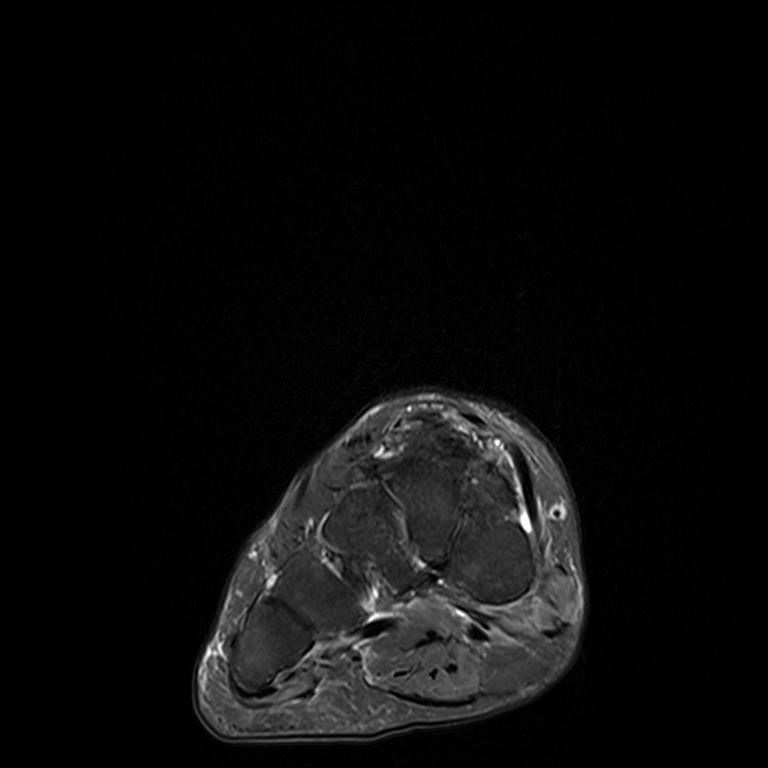
[im 19/33]
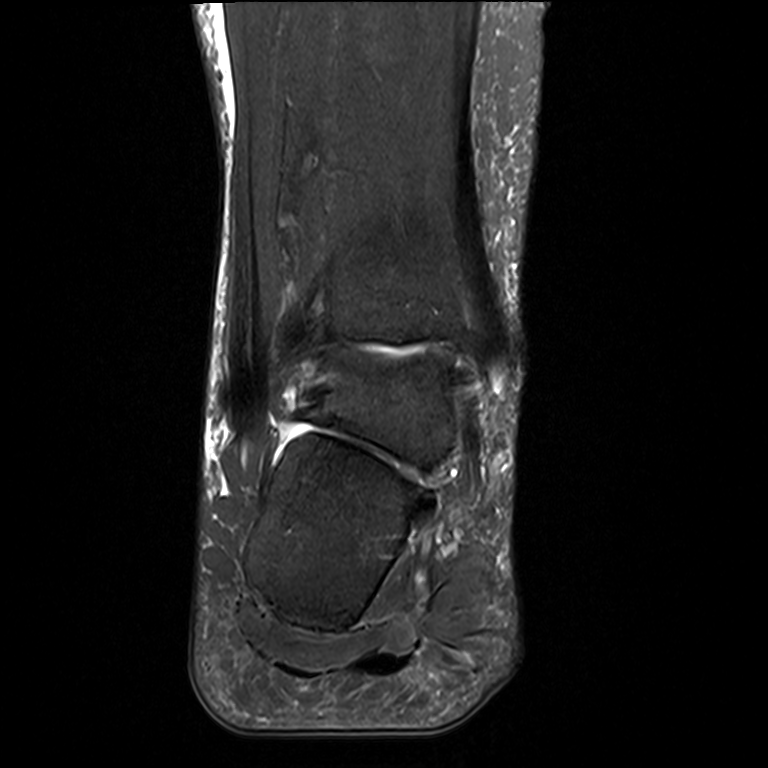
[im 28/33]
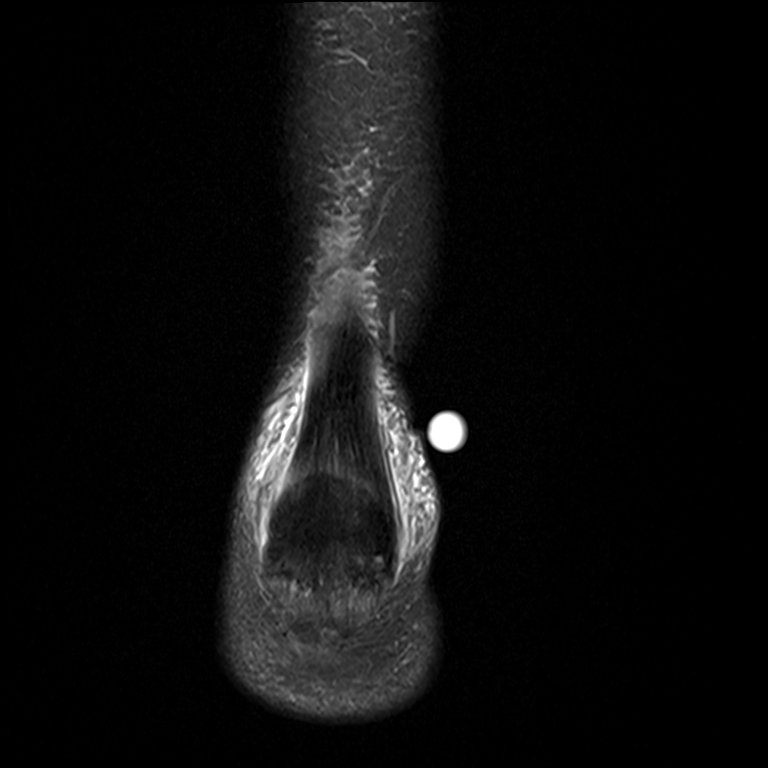

[8 of 40 positions shown; findings below may reference images not displayed]

FINDINGS: TENDONS

Peroneal: Os peroneus with abnormal accentuated increased T2 signal
on image [DATE], with mild surrounding tendinopathy.

Posteromedial: Mild distal tibialis posterior tendinopathy and
tenosynovitis.

Anterior: Unremarkable

Achilles: Moderate distal Achilles tendinopathy with fusiform
expansion of the distal 6.6 cm of the tendon, with some internal
edema and potentially some mild intrasubstance insertional partial
tearing for example on image [DATE]. Edema in the surrounding soft
tissues suggests Achilles peritendinitis. Minimal pre Achilles
bursitis.

Plantar Fascia: Thickening of the medial band of the plantar fascia
compatible with mild plantar fasciitis.

LIGAMENTS

Lateral: Attenuated anterior talofibular ligament likely from an old
injury. No local synovitis in this vicinity.

Medial: Mildly thickened and irregular superomedial portion of the
spring ligament.

CARTILAGE

Ankle Joint: Diffuse mild to moderate degenerative chondral
thinning. No focal osteochondral lesion observed.

Subtalar Joints/Sinus Tarsi: Unremarkable

Bones: There is considerable dorsal midfoot spurring and
degenerative arthropathy is specially at the articulation of the
navicular and cuneiforms, with a small dorsal effusion between the
medial cuneiform and the navicular.

Plantar calcaneal spur.  Achilles calcaneal spur.

Other: No supplemental non-categorized findings.
IMPRESSION: 1. Moderate distal Achilles tendinopathy with expansion and
increased signal in the distal tendon, mild Achilles peritendinitis.
, trace pre Achilles bursitis, and potentially some minimal
intrasubstance partial thickness tearing at the insertion.
2. Mild plantar fasciitis.
3. Edematous os peroneus with mild adjacent tendinopathy in the
peroneus longus tendon.
4. Mild distal tibialis posterior tendinopathy and tenosynovitis,
correlate clinically in assessing for tibialis posterior
dysfunction.
5. Considerable dorsal spurring at the articulation of the navicular
and the cuneiforms, with a mild dorsal effusion between the
navicular and medial cuneiform.
6. Attenuated anterior talofibular ligament likely from an old
injury, without local synovitis to suggest anterolateral
impingement.

## 2020-04-13 ENCOUNTER — Telehealth: Payer: Self-pay

## 2020-04-13 NOTE — Telephone Encounter (Signed)
NOTES ON FILE FROM [facility] [phone] SENT REFERRAL TO SCHEDULING 

## 2020-05-03 ENCOUNTER — Other Ambulatory Visit: Payer: Self-pay

## 2020-05-03 ENCOUNTER — Encounter: Payer: Self-pay | Admitting: Internal Medicine

## 2020-05-03 ENCOUNTER — Ambulatory Visit (INDEPENDENT_AMBULATORY_CARE_PROVIDER_SITE_OTHER): Payer: 59 | Admitting: Internal Medicine

## 2020-05-03 VITALS — BP 132/76 | HR 66 | Ht 64.0 in | Wt 237.0 lb

## 2020-05-03 DIAGNOSIS — E7849 Other hyperlipidemia: Secondary | ICD-10-CM | POA: Diagnosis not present

## 2020-05-03 DIAGNOSIS — I209 Angina pectoris, unspecified: Secondary | ICD-10-CM | POA: Insufficient documentation

## 2020-05-03 DIAGNOSIS — I272 Pulmonary hypertension, unspecified: Secondary | ICD-10-CM

## 2020-05-03 MED ORDER — ROSUVASTATIN CALCIUM 10 MG PO TABS: 10.0000 mg | ORAL_TABLET | Freq: Every day | ORAL | 3 refills | Status: DC

## 2020-05-03 MED ORDER — METOPROLOL TARTRATE 100 MG PO TABS: 100.0000 mg | ORAL_TABLET | Freq: Once | ORAL | 0 refills | Status: DC

## 2020-05-03 NOTE — Progress Notes (Signed)
Cardiology Office Note:    Date:  05/03/2020   ID:  Whitney [name], DOB May 20, 1956, MRN [id number]  PCP:  Jani Gravel, MD  [facility] Cardiologist:  No primary care provider on file.  Pernell Dupre MD seen in 2016 [place] Electrophysiologist:  None   CC: chest discomfort Consulted for the evaluation of chest pain needs stress test at the behest of Jani Gravel, MD  History of Present Illness:    Whitney [name] is a 64 y.o. female with a hx of HLD and prior TIA, OSA on CPAP, Morbid Obesity who presents for evaluation.  Patient notes that she is feeling fine at rest.  Has had noted chest ache in the left chest that radiate into the arm and shoulder and neck, without chest pressure, chest tightness, chest stinging.  Discomfort occurs spontaneously sitting on the couch, driving at work, and with stress at work  and improves spotaneously.  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  No shortness of breath, DOE.  No PND or orthopnea.  No bendopnea, weight gain, but does have chronic R leg swelling, without abdominal swelling.  No syncope or near syncope.  Previous exposure to Fen-Fen.  Notes no palpitations or funny heart beats.     Patient reports prior cardiac testing including exercise stress test distant past that was negtaive.  Past Medical History:  Diagnosis Date  . Allergy   . Arthritis   . Chest pain   . Cough   . Depression   . GERD (gastroesophageal reflux disease)   . Hayfever   . Hyperlipidemia   . IBS (irritable bowel syndrome)   . Obesity   . OSA (obstructive sleep apnea)   . Pure hypercholesterolemia   . Right leg pain   . TIA (transient ischemic attack)     Past Surgical History:  Procedure Laterality Date  . CHOLECYSTECTOMY    . DILATION AND CURETTAGE OF UTERUS    . FOOT SURGERY      Current Medications: Current Meds  Medication Sig  . aspirin 81 MG tablet Take 81 mg by mouth daily.  .  Azelastine HCl 137 MCG/SPRAY SOLN   . [place]-Glucosamine-Vit D (OSTEO BI-FLEX ONE PER DAY PO) Take by mouth.  . Cholecalciferol (VITAMIN D3) 2000 UNITS TABS Take by mouth.  . fexofenadine (ALLEGRA) 60 MG tablet Take 60 mg by mouth 2 (two) times daily.  . fluticasone (FLONASE) 50 MCG/ACT nasal spray   . fluticasone (VERAMYST) 27.5 MCG/SPRAY nasal spray Place 2 sprays into the nose daily.  . magnesium oxide (MAG-OX) 400 MG tablet Take 400 mg by mouth daily.  . montelukast (SINGULAIR) 10 MG tablet Take 10 mg by mouth daily.  . Multiple Vitamin (MULTI VITAMIN DAILY PO) Take by mouth daily.   . Omega-3 Fatty Acids (FISH OIL PO) Take 1 tablet by mouth daily.  . RESTASIS 0.05 % ophthalmic emulsion   . sertraline (ZOLOFT) 50 MG tablet Take 50 mg by mouth daily.     Allergies:   Patient has no known allergies.   Social History   Socioeconomic History  . Marital status: Married    Spouse name: Not on file  . Number of children: Not on file  . Years of education: Not on file  . Highest education level: Not on file  Occupational History  . Not on file  Tobacco Use  . Smoking status: Never Smoker  . Smokeless tobacco: Never Used  Vaping Use  . Vaping Use: Never used  Substance and Sexual Activity  . Alcohol use: No  . Drug use: No  . Sexual activity: Not on file  Other Topics Concern  . Not on file  Social History Narrative  . Not on file   Social Determinants of Health   Financial Resource Strain:   . Difficulty of Paying Living Expenses: Not on file  Food Insecurity:   . Worried About Charity fundraiser in the Last Year: Not on file  . Ran Out of Food in the Last Year: Not on file  Transportation Needs:   . Lack of Transportation (Medical): Not on file  . Lack of Transportation (Non-Medical): Not on file  Physical Activity:   . Days of Exercise per Week: Not on file  . Minutes of Exercise per Session: Not on file  Stress:   . Feeling of Stress : Not on file  Social  Connections:   . Frequency of Communication with Friends and Family: Not on file  . Frequency of Social Gatherings with Friends and Family: Not on file  . Attends Religious Services: Not on file  . Active Member of Clubs or Organizations: Not on file  . Attends Archivist Meetings: Not on file  . Marital Status: Not on file   From [facility]  Family History: The patient's family history includes Alzheimer's disease in her mother; Cerebral palsy in her daughter; Cirrhosis in her father; Diabetes type II in her father; Hyperlipidemia in her father. Denies family history of sudden cardiac death including drowning, car accidents, or unexplained deaths in the family. No history of non-ischemic cardiomyopathies including hypertrophic cardiomyopathy, left ventricular non-compaction, or arrhythmogenic right ventricular cardiomyopathy. No history of bicuspid aortic valve or aortic aneurysm or dissection. History of coronary artery disease notable for no CAD. History of arrhythmia notable for no rhythm issues.  ROS:   Please see the history of present illness.    All other systems reviewed and are negative.  EKGs/Labs/Other Studies Reviewed:    The following studies were reviewed today:  EKG:  EKG is  ordered today.  The ekg ordered today demonstrates SR 66 with artifactual baseline 06/26/2014: SR 78 WNL  Recent Labs: No results found for requested labs within last 8760 hours.  Recent Lipid Panel No results found for: CHOL, TRIG, HDL, CHOLHDL, VLDL, LDLCALC, LDLDIRECT  OSH Labs 04/03/20: Cholesterol 227 HDL 53 LDL 150 TG 133  Creatinine 0.75 03/28/20  Risk Assessment/Calculations:     ASCVD 7.7%  Physical Exam:    VS:  BP 132/76   Pulse 66   Ht 5' 4"  (1.626 m)   Wt 237 lb (107.5 kg)   SpO2 97%   BMI 40.68 kg/m     Wt Readings from Last 3 Encounters:  05/03/20 237 lb (107.5 kg)  10/20/18 230 lb (104.3 kg)  08/30/14 235 lb (106.6 kg)     GEN: [place], well  developed in no acute distress HEENT: Normal NECK: No JVD; No carotid bruits LYMPHATICS: No lymphadenopathy CARDIAC: RRR, no murmurs, rubs, gallops RESPIRATORY:  Clear to auscultation without rales, wheezing or rhonchi  ABDOMEN: Soft, non-tender, non-distended MUSCULOSKELETAL:  No edema; No deformity  SKIN: Warm and dry NEUROLOGIC:  Alert and oriented x 3 PSYCHIATRIC:  Normal affect   ASSESSMENT:    1. Angina pectoris ([place])   2. Other hyperlipidemia   3. Morbid obesity ([place])   4. Pulmonary hypertension, unspecified ([place])    PLAN:  In order of problems listed above:  Angina Pectois HLD Morbid Obesity with previous exposure to Fen-Fen - The patient presents with anginal chest pain - ASCVD risk estimated at 7.7% - Rosuvastatin 10 mg PO daily with 3 month f/u for lipids and lfts - Would recommend an echocardiogram to pulmonary hypertension - Would recommend CCTA +/- FFR to exclude obstructive CAD  - if positive, discussed risks and benefits of cardiac catheterization have been discussed with the patient.  These include bleeding, infection, kidney damage, stroke, heart attack, death.  The patient understands these risks and is willing to proceed if necessary  3 month follow up unless new symptoms or abnormal test results warranting change in plan  Would be reasonable for  APP Follow up    Shared Decision Making/Informed Consent      Patient amenable to plan  Medication Adjustments/Labs and Tests Ordered: Current medicines are reviewed at length with the patient today.  Concerns regarding medicines are outlined above.  Orders Placed This Encounter  Procedures  . CT CORONARY MORPH W/CTA COR W/SCORE W/CA W/CM &/OR WO/CM  . CT CORONARY FRACTIONAL FLOW RESERVE DATA PREP  . CT CORONARY FRACTIONAL FLOW RESERVE FLUID ANALYSIS  . Lipid panel  . Hepatic function panel  . EKG 12-Lead  . ECHOCARDIOGRAM COMPLETE   Meds ordered this encounter  Medications  . rosuvastatin  (CRESTOR) 10 MG tablet    Sig: Take 1 tablet (10 mg total) by mouth daily.    Dispense:  90 tablet    Refill:  3  . metoprolol tartrate (LOPRESSOR) 100 MG tablet    Sig: Take 1 tablet (100 mg total) by mouth once for 1 dose. TAKE 2 HOURS PRIOR TO PROCEDURE    Dispense:  1 tablet    Refill:  0    Patient Instructions  Medication Instructions:    START TAKING:  ROSUVASTATIN (CRESTOR) 10 MG ONCE A DAY   TAKE ONLY ONCE: 2 HOURS PRIOR TO PROCEDURE METOPROLOL 100 MG   *If you need a refill on your cardiac medications before your next appointment, please call your pharmacy*   Lab Work:  BMET LABS WILL BE SCANNED IN FROM RECORDS RECEIVED   RETURN FOR FASTING  LABS LIPIDS AND LIVER IN 3 MONTHS FOLLOW UP   If you have labs (blood work) drawn today and your tests are completely normal, you will receive your results only by: Marland Kitchen MyChart Message (if you have MyChart) OR . A paper copy in the mail If you have any lab test that is abnormal or we need to change your treatment, we will call you to review the results.   Testing/Procedures: Your physician has requested that you have an echocardiogram. Echocardiography is a painless test that uses sound waves to create images of your heart. It provides your doctor with information about the size and shape of your heart and how well your heart's chambers and valves are working. This procedure takes approximately one hour. There are no restrictions for this procedure.  Non-Cardiac CT Angiography (CTA), is a special type of CT scan that uses a computer to produce multi-dimensional views of major blood vessels throughout the body. In CT angiography, a contrast material is injected through an IV to help visualize the blood vessels    Follow-Up: At [facility], you and your health needs are our priority.  As part of our continuing mission to provide you with exceptional heart care, we have created designated Provider Care Teams.  These Care Teams  include your primary Cardiologist (physician) and Advanced Practice Providers (APPs -  Physician Assistants and Nurse Practitioners) who all work together to provide you with the care you need, when you need it.  We recommend signing up for the patient portal called "MyChart".  Sign up information is provided on this After Visit Summary.  MyChart is used to connect with patients for Virtual Visits (Telemedicine).  Patients are able to view lab/test results, encounter notes, upcoming appointments, etc.  Non-urgent messages can be sent to your provider as well.   To learn more about what you can do with MyChart, go to [email or web address].    Your next appointment:   3 month(s)  The format for your next appointment:   In Person  Provider:   Rudean Haskell, MD   Other Instructions  Your cardiac CT will be scheduled at one of the below locations:   [facility] [address] [place], [place] [address] [phone]  [place] [address] [place], [place] [address] [phone]  If scheduled at [facility], please arrive at the [facility] main entrance of [facility] 30 minutes prior to test start time. Proceed to the [facility] Radiology Department (first floor) to check-in and test prep.  If scheduled at [facility], please arrive 15 mins early for check-in and test prep.  Please follow these instructions carefully (unless otherwise directed):   On the Night Before the Test: . Be sure to Drink plenty of water. . Do not consume any caffeinated/decaffeinated beverages or chocolate 12 hours prior to your test. . Do not take any antihistamines 12 hours prior to your test.  On the Day of the Test: . Drink plenty of water. Do not drink any water within one hour of the test. . Do not eat any food 4 hours prior to the test. . You may take your regular medications  prior to the test.  . Take metoprolol (Lopressor) two hours prior to test. . FEMALES- please wear underwire-free bra if available   *For Clinical Staff only. Please instruct patient the following:*        -Drink plenty of water        Take metoprolol (Lopressor) 2 hours prior to test (if applicable).           patient is less than or equal to 110 yrs old Lopressor 186m x1.         After the Test: . Drink plenty of water. . After receiving IV contrast, you may experience a mild flushed feeling. This is normal. . On occasion, you may experience a mild rash up to 24 hours after the test. This is not dangerous. If this occurs, you can take Benadryl 25 mg and increase your fluid intake. . If you experience trouble breathing, this can be serious. If it is severe call 911 IMMEDIATELY. If it is mild, please call our office. . If you take any of these medications: Glipizide/Metformin, Avandament, Glucavance, please do not take 48 hours after completing test unless otherwise instructed.   Once we have confirmed authorization from your insurance company, we will call you to set up a date and time for your test. Based on how quickly your insurance processes prior authorizations requests, please allow up to 4 weeks to be contacted for scheduling your Cardiac CT appointment. Be advised that routine Cardiac CT appointments could be scheduled as many as 8 weeks after your  provider has ordered it.  For non-scheduling related questions, please contact the cardiac imaging nurse navigator should you have any questions/concerns: Marchia Bond, Cardiac Imaging Nurse Navigator Burley Saver, Interim Cardiac Imaging Nurse [place] and Vascular Services Direct Office Dial: [phone]   For scheduling needs, including cancellations and rescheduling, please call [place], [phone].     Signed, Werner Lean, MD  05/03/2020 12:13 PM    [place]

## 2020-05-03 NOTE — Patient Instructions (Addendum)
Medication Instructions:    START TAKING:  ROSUVASTATIN (CRESTOR) 10 MG ONCE A DAY   TAKE ONLY ONCE: 2 HOURS PRIOR TO PROCEDURE METOPROLOL 100 MG   *If you need a refill on your cardiac medications before your next appointment, please call your pharmacy*   Lab Work:  BMET LABS WILL BE SCANNED IN FROM RECORDS RECEIVED   RETURN FOR FASTING  LABS LIPIDS AND LIVER IN 3 MONTHS FOLLOW UP   If you have labs (blood work) drawn today and your tests are completely normal, you will receive your results only by: Marland Kitchen MyChart Message (if you have MyChart) OR . A paper copy in the mail If you have any lab test that is abnormal or we need to change your treatment, we will call you to review the results.   Testing/Procedures: Your physician has requested that you have an echocardiogram. Echocardiography is a painless test that uses sound waves to create images of your heart. It provides your doctor with information about the size and shape of your heart and how well your heart's chambers and valves are working. This procedure takes approximately one hour. There are no restrictions for this procedure.  Non-Cardiac CT Angiography (CTA), is a special type of CT scan that uses a computer to produce multi-dimensional views of major blood vessels throughout the body. In CT angiography, a contrast material is injected through an IV to help visualize the blood vessels    Follow-Up: At [facility], you and your health needs are our priority.  As part of our continuing mission to provide you with exceptional heart care, we have created designated Provider Care Teams.  These Care Teams include your primary Cardiologist (physician) and Advanced Practice Providers (APPs -  Physician Assistants and Nurse Practitioners) who all work together to provide you with the care you need, when you need it.  We recommend signing up for the patient portal called "MyChart".  Sign up information is provided on this After Visit  Summary.  MyChart is used to connect with patients for Virtual Visits (Telemedicine).  Patients are able to view lab/test results, encounter notes, upcoming appointments, etc.  Non-urgent messages can be sent to your provider as well.   To learn more about what you can do with MyChart, go to [email or web address].    Your next appointment:   3 month(s)  The format for your next appointment:   In Person  Provider:   Rudean Haskell, MD   Other Instructions  Your cardiac CT will be scheduled at one of the below locations:   [facility] [address] [place], [place] [address] [phone]  [place] [address] [place], [place] [address] [phone]  If scheduled at [facility], please arrive at the [facility] main entrance of [facility] 30 minutes prior to test start time. Proceed to the [facility] Radiology Department (first floor) to check-in and test prep.  If scheduled at [facility], please arrive 15 mins early for check-in and test prep.  Please follow these instructions carefully (unless otherwise directed):   On the Night Before the Test: . Be sure to Drink plenty of water. . Do not consume any caffeinated/decaffeinated beverages or chocolate 12 hours prior to your test. . Do not take any antihistamines 12 hours prior to your test.  On the Day of the Test: . Drink plenty of water. Do not drink any water within one hour  of the test. . Do not eat any food 4 hours prior to the test. . You may take your regular medications prior to the test.  . Take metoprolol (Lopressor) two hours prior to test. . FEMALES- please wear underwire-free bra if available   *For Clinical Staff only. Please instruct patient the following:*        -Drink plenty of water        Take metoprolol (Lopressor) 2 hours prior to test (if applicable).           patient is  less than or equal to 65 yrs old Lopressor 188m x1.         After the Test: . Drink plenty of water. . After receiving IV contrast, you may experience a mild flushed feeling. This is normal. . On occasion, you may experience a mild rash up to 24 hours after the test. This is not dangerous. If this occurs, you can take Benadryl 25 mg and increase your fluid intake. . If you experience trouble breathing, this can be serious. If it is severe call 911 IMMEDIATELY. If it is mild, please call our office. . If you take any of these medications: Glipizide/Metformin, Avandament, Glucavance, please do not take 48 hours after completing test unless otherwise instructed.   Once we have confirmed authorization from your insurance company, we will call you to set up a date and time for your test. Based on how quickly your insurance processes prior authorizations requests, please allow up to 4 weeks to be contacted for scheduling your Cardiac CT appointment. Be advised that routine Cardiac CT appointments could be scheduled as many as 8 weeks after your provider has ordered it.  For non-scheduling related questions, please contact the cardiac imaging nurse navigator should you have any questions/concerns: SMarchia Bond Cardiac Imaging Nurse Navigator MBurley Saver Interim Cardiac Imaging Nurse [place] [facility] Direct Office Dial: [phone]  For scheduling needs, including cancellations and rescheduling, please call [place] [phone]

## 2020-05-17 ENCOUNTER — Other Ambulatory Visit: Payer: Self-pay | Admitting: *Deleted

## 2020-05-17 DIAGNOSIS — I209 Angina pectoris, unspecified: Secondary | ICD-10-CM

## 2020-05-17 DIAGNOSIS — R0789 Other chest pain: Secondary | ICD-10-CM

## 2020-05-29 ENCOUNTER — Ambulatory Visit (HOSPITAL_COMMUNITY): Payer: 59 | Attending: Cardiology

## 2020-05-29 ENCOUNTER — Other Ambulatory Visit: Payer: Self-pay

## 2020-05-29 ENCOUNTER — Telehealth (HOSPITAL_COMMUNITY): Payer: Self-pay | Admitting: *Deleted

## 2020-05-29 ENCOUNTER — Other Ambulatory Visit: Admitting: *Deleted

## 2020-05-29 DIAGNOSIS — R0789 Other chest pain: Secondary | ICD-10-CM

## 2020-05-29 DIAGNOSIS — I272 Pulmonary hypertension, unspecified: Secondary | ICD-10-CM | POA: Insufficient documentation

## 2020-05-29 DIAGNOSIS — I209 Angina pectoris, unspecified: Secondary | ICD-10-CM

## 2020-05-29 LAB — BASIC METABOLIC PANEL
BUN/Creatinine Ratio: 20 (ref 12–28)
BUN: 17 mg/dL (ref 8–27)
CO2: 22 mmol/L (ref 20–29)
Calcium: 9.6 mg/dL (ref 8.7–10.3)
Chloride: 100 mmol/L (ref 96–106)
Creatinine, Ser: 0.86 mg/dL (ref 0.57–1.00)
GFR calc Af Amer: 84 mL/min/{1.73_m2} (ref >59)
GFR calc non Af Amer: 73 mL/min/{1.73_m2} (ref >59)
Glucose: 119 mg/dL — ABNORMAL HIGH (ref 65–99)
Potassium: 3.9 mmol/L (ref 3.5–5.2)
Sodium: 137 mmol/L (ref 134–144)

## 2020-05-29 LAB — ECHOCARDIOGRAM COMPLETE
Area-P 1/2: 4.17 cm2
S' Lateral: 2.2 cm

## 2020-05-29 NOTE — Telephone Encounter (Signed)
Attempted to call patient regarding upcoming cardiac CT appointment. Left message on voicemail with name and callback number  Valor Quaintance Tai RN Navigator Cardiac Imaging [place] [facility] [phone] Office [phone] Cell  

## 2020-05-30 ENCOUNTER — Telehealth (HOSPITAL_COMMUNITY): Payer: Self-pay | Admitting: Emergency Medicine

## 2020-05-30 NOTE — Telephone Encounter (Signed)
Reaching out to patient to offer assistance regarding upcoming cardiac imaging study; pt verbalizes understanding of appt date/time, parking situation and where to check in, pre-test NPO status and medications ordered, and verified current allergies; name and call back number provided for further questions should they arise Rockwell Alexandria RN Navigator Cardiac Imaging [facility] [phone] office [phone] cell  100mg  metop 2 hr prior to scan 

## 2020-05-31 ENCOUNTER — Ambulatory Visit (HOSPITAL_COMMUNITY)
Admission: RE | Admit: 2020-05-31 | Discharge: 2020-05-31 | Disposition: A | Discharge status: Home / Self Care | Payer: 59 | Source: Ambulatory Visit | Attending: Internal Medicine | Admitting: Internal Medicine

## 2020-05-31 ENCOUNTER — Encounter: Admitting: *Deleted

## 2020-05-31 ENCOUNTER — Other Ambulatory Visit: Payer: Self-pay

## 2020-05-31 DIAGNOSIS — I209 Angina pectoris, unspecified: Secondary | ICD-10-CM | POA: Insufficient documentation

## 2020-05-31 DIAGNOSIS — Z006 Encounter for examination for normal comparison and control in clinical research program: Secondary | ICD-10-CM

## 2020-05-31 MED ORDER — IOHEXOL 350 MG/ML SOLN
80.0000 mL | Freq: Once | INTRAVENOUS | Status: AC | PRN
  Administered 2020-05-31: 80 mL via INTRAVENOUS

## 2020-05-31 MED ORDER — NITROGLYCERIN 0.4 MG SL SUBL
SUBLINGUAL_TABLET | SUBLINGUAL | Status: AC
  Administered 2020-05-31: 0.8 mg via SUBLINGUAL
  Filled 2020-05-31: qty 2

## 2020-05-31 MED ORDER — NITROGLYCERIN 0.4 MG SL SUBL: 0.8000 mg | SUBLINGUAL_TABLET | SUBLINGUAL | Status: DC | PRN

## 2020-05-31 NOTE — Research (Signed)
Subject Name: Whitney [name]  Subject met inclusion and exclusion criteria.  The informed consent form, study requirements and expectations were reviewed with the subject and questions and concerns were addressed prior to the signing of the consent form.  The subject verbalized understanding of the trial requirements.  The subject agreed to participate in the IDENTIFY trial and signed the informed consent at 1120 on 05/31/20  The informed consent was obtained prior to performance of any protocol-specific procedures for the subject.  A copy of the signed informed consent was given to the subject and a copy was placed in the subject's medical record.   Timoteo Gaul

## 2020-08-16 ENCOUNTER — Other Ambulatory Visit: Payer: Self-pay

## 2020-08-16 ENCOUNTER — Other Ambulatory Visit: Admitting: *Deleted

## 2020-08-16 DIAGNOSIS — E7849 Other hyperlipidemia: Secondary | ICD-10-CM

## 2020-08-16 LAB — LIPID PANEL
Chol/HDL Ratio: 2.4 ratio (ref 0.0–4.4)
Cholesterol, Total: 151 mg/dL (ref 100–199)
HDL: 62 mg/dL (ref >39)
LDL Chol Calc (NIH): 75 mg/dL (ref 0–99)
Triglycerides: 73 mg/dL (ref 0–149)
VLDL Cholesterol Cal: 14 mg/dL (ref 5–40)

## 2020-08-16 LAB — HEPATIC FUNCTION PANEL
ALT: 19 IU/L (ref 0–32)
AST: 26 IU/L (ref 0–40)
Albumin: 4.7 g/dL (ref 3.8–4.8)
Alkaline Phosphatase: 77 IU/L (ref 44–121)
Bilirubin Total: 0.4 mg/dL (ref 0.0–1.2)
Bilirubin, Direct: 0.12 mg/dL (ref 0.00–0.40)
Total Protein: 7.5 g/dL (ref 6.0–8.5)

## 2020-08-23 ENCOUNTER — Encounter: Payer: Self-pay | Admitting: Internal Medicine

## 2020-08-23 ENCOUNTER — Ambulatory Visit (INDEPENDENT_AMBULATORY_CARE_PROVIDER_SITE_OTHER): Payer: 59 | Admitting: Internal Medicine

## 2020-08-23 ENCOUNTER — Other Ambulatory Visit: Payer: Self-pay

## 2020-08-23 DIAGNOSIS — I700 Atherosclerosis of aorta: Secondary | ICD-10-CM

## 2020-08-23 DIAGNOSIS — G4733 Obstructive sleep apnea (adult) (pediatric): Secondary | ICD-10-CM

## 2020-08-23 MED ORDER — ROSUVASTATIN CALCIUM 20 MG PO TABS: 20.0000 mg | ORAL_TABLET | Freq: Every day | ORAL | 3 refills | Status: DC

## 2020-08-23 NOTE — Progress Notes (Signed)
Cardiology Office Note:    Date:  08/23/2020   ID:  Whitney [name], DOB September 26, 1955, MRN [id number]  PCP:  Pearson Grippe, MD  [facility] Cardiologist: Riley Lam MD [facility] Electrophysiologist:  None   CC: chest discomfort follow up  History of Present Illness:    Whitney [name] is a 65 y.o. female with a hx of HLD and prior TIA, OSA on CPAP, Morbid Obesity who presents for evaluation 05/03/20.  In interim of this visit, patient had normal echo and Aortic atherosclerosis on CT.  Patient notes that she is feeling tired related to her dog waking her up.  Since last visit notes new CPAP machine that the has the dog doesn't like .  Relevant interval testing or therapy include .  There are no interval hospital/ED visit.    Has some slight residual chest twinge while driving that quickly dissipates .  No SOB/DOE and no PND/Orthopnea.  Notes seasonal allergies.  No palpitations or syncope .  Past Medical History:  Diagnosis Date  . Allergy   . Arthritis   . Chest pain   . Cough   . Depression   . GERD (gastroesophageal reflux disease)   . Hayfever   . Hyperlipidemia   . IBS (irritable bowel syndrome)   . Obesity   . OSA (obstructive sleep apnea)   . Pure hypercholesterolemia   . Right leg pain   . TIA (transient ischemic attack)     Past Surgical History:  Procedure Laterality Date  . CHOLECYSTECTOMY    . DILATION AND CURETTAGE OF UTERUS    . FOOT SURGERY      Current Medications: Current Meds  Medication Sig  . aspirin 81 MG tablet Take 81 mg by mouth daily.  . Azelastine HCl 137 MCG/SPRAY SOLN   . [place]wellia-Glucosamine-Vit D (OSTEO BI-FLEX ONE PER DAY PO) Take by mouth.  . Cholecalciferol (VITAMIN D3) 2000 UNITS TABS Take by mouth.  . fexofenadine (ALLEGRA) 60 MG tablet Take 60 mg by mouth 2 (two) times daily.  . fluticasone (FLONASE) 50 MCG/ACT nasal spray   . fluticasone (VERAMYST) 27.5 MCG/SPRAY nasal spray Place 2 sprays into the nose daily.   . magnesium oxide (MAG-OX) 400 MG tablet Take 400 mg by mouth daily.  . montelukast (SINGULAIR) 10 MG tablet Take 10 mg by mouth daily.  . Multiple Vitamin (MULTI VITAMIN DAILY PO) Take by mouth daily.   . Omega-3 Fatty Acids (FISH OIL PO) Take 1 tablet by mouth daily.  . RESTASIS 0.05 % ophthalmic emulsion   . rosuvastatin (CRESTOR) 20 MG tablet Take 1 tablet (20 mg total) by mouth daily.  . sertraline (ZOLOFT) 50 MG tablet Take 50 mg by mouth daily.     Allergies:   Patient has no known allergies.   Social History   Socioeconomic History  . Marital status: Married    Spouse name: Not on file  . Number of children: Not on file  . Years of education: Not on file  . Highest education level: Not on file  Occupational History  . Not on file  Tobacco Use  . Smoking status: Never Smoker  . Smokeless tobacco: Never Used  Vaping Use  . Vaping Use: Never used  Substance and Sexual Activity  . Alcohol use: No  . Drug use: No  . Sexual activity: Not on file  Other Topics Concern  . Not on file  Social History Narrative  . Not on file   Social Determinants of  Health   Financial Resource Strain: Not on file  Food Insecurity: Not on file  Transportation Needs: Not on file  Physical Activity: Not on file  Stress: Not on file  Social Connections: Not on file   From [place]  Family History: The patient's family history includes Alzheimer's disease in her mother; Cerebral palsy in her daughter; Cirrhosis in her father; Diabetes type II in her father; Hyperlipidemia in her father. Denies family history of sudden cardiac death including drowning, car accidents, or unexplained deaths in the family. No history of non-ischemic cardiomyopathies including hypertrophic cardiomyopathy, left ventricular non-compaction, or arrhythmogenic right ventricular cardiomyopathy. No history of bicuspid aortic valve or aortic aneurysm or dissection. History of coronary artery disease notable for  no CAD. History of arrhythmia notable for no rhythm issues.  ROS:   Please see the history of present illness.    All other systems reviewed and are negative.  EKGs/Labs/Other Studies Reviewed:    The following studies were reviewed today:  EKG:   05/03/20 SR 66 with artifactual baseline 06/26/2014: SR 78 WNL  Transthoracic Echocardiogram: Date: 05/29/21 Results: 1. Left ventricular ejection fraction, by estimation, is 60 to 65%. The  left ventricle has normal function. The left ventricle has no regional  wall motion abnormalities. Left ventricular diastolic parameters are  consistent with Grade I diastolic  dysfunction (impaired relaxation).  2. Right ventricular systolic function is normal. The right ventricular  size is normal.  3. The mitral valve is normal in structure. No evidence of mitral valve  regurgitation. No evidence of mitral stenosis.  4. The aortic valve is normal in structure. Aortic valve regurgitation is  not visualized. No aortic stenosis is present.  5. The inferior vena cava is normal in size with greater than 50%  respiratory variability, suggesting right atrial pressure of 3 mmHg.    CardiacCT : Date: 05/31/20 Results: IMPRESSION: 1. Coronary calcium score of 34.4. This was 76th percentile for age, sex, and race matched control.  2. Normal coronary origin.  Right dominance.  3. Ascending Aortic Atherosclerosis noted.  4. Mild non-obstructive CAD (25-49%) noted in LAD. Consider non-atherosclerotic causes of chest pain. Consider preventive therapy and risk factor modification.    Recent Labs: 05/29/2020: BUN 17; Creatinine, Ser 0.86; Potassium 3.9; Sodium 137 08/16/2020: ALT 19  Recent Lipid Panel    Component Value Date/Time   CHOL 151 08/16/2020 0743   TRIG 73 08/16/2020 0743   HDL 62 08/16/2020 0743   CHOLHDL 2.4 08/16/2020 0743   LDLCALC 75 08/16/2020 0743    Risk Assessment/Calculations:     N/A  Physical Exam:     VS:  BP 120/74   Pulse 100   Ht 5\' 4"  (1.626 m)   Wt 231 lb 6.4 oz (105 kg)   SpO2 93%   BMI 39.72 kg/m     Wt Readings from Last 3 Encounters:  08/23/20 231 lb 6.4 oz (105 kg)  05/03/20 237 lb (107.5 kg)  10/20/18 230 lb (104.3 kg)    GEN: Obese, well developed in no acute distress HEENT: Normal NECK: No JVD; No carotid bruits LYMPHATICS: No lymphadenopathy CARDIAC: RRR, no murmurs, rubs, gallops thought distant heart sounds RESPIRATORY:  Clear to auscultation without rales, wheezing or rhonchi  ABDOMEN: Soft, non-tender, non-distended MUSCULOSKELETAL:  No edema; No deformity  SKIN: Warm and dry NEUROLOGIC:  Alert and oriented x 3 PSYCHIATRIC:  Normal affect   ASSESSMENT:    1. Morbid obesity (HCC)   2. Obstructive sleep apnea  3. Aortic atherosclerosis (HCC)    PLAN:    In order of problems listed above:  Aortic atherosclerosis Morbid Obesity with HLD OSA with new CPAP - LDL above 70 - will increase statin to 20 mg; continue ASA; lipids and lfts in three months - discussed pros and cons of Omega 3 fatty acids - Reviewed imaging with patient  One year follow up unless new symptoms or abnormal test results warranting change in plan  Would be reasonable for APP Follow up       Medication Adjustments/Labs and Tests Ordered: Current medicines are reviewed at length with the patient today.  Concerns regarding medicines are outlined above.  Orders Placed This Encounter  Procedures  . Hepatic function panel  . Lipid panel   Meds ordered this encounter  Medications  . rosuvastatin (CRESTOR) 20 MG tablet    Sig: Take 1 tablet (20 mg total) by mouth daily.    Dispense:  90 tablet    Refill:  3    Patient Instructions  Medication Instructions:  Your physician has recommended you make the following change in your medication:  INCREASE: rosuvastatin to 20 mg by mouth daily  *If you need a refill on your cardiac medications before your next appointment,  please call your pharmacy*   Lab Work: IN 3 MONTHS: LFT and FLP If you have labs (blood work) drawn today and your tests are completely normal, you will receive your results only by: Marland Kitchen MyChart Message (if you have MyChart) OR . A paper copy in the mail If you have any lab test that is abnormal or we need to change your treatment, we will call you to review the results.   Testing/Procedures: NONE   Follow-Up: At [facility], you and your health needs are our priority.  As part of our continuing mission to provide you with exceptional heart care, we have created designated Provider Care Teams.  These Care Teams include your primary Cardiologist (physician) and Advanced Practice Providers (APPs -  Physician Assistants and Nurse Practitioners) who all work together to provide you with the care you need, when you need it.  We recommend signing up for the patient portal called "MyChart".  Sign up information is provided on this After Visit Summary.  MyChart is used to connect with patients for Virtual Visits (Telemedicine).  Patients are able to view lab/test results, encounter notes, upcoming appointments, etc.  Non-urgent messages can be sent to your provider as well.   To learn more about what you can do with MyChart, go to [email or web address].    Your next appointment:   12 month(s)  The format for your next appointment:   In Person  Provider:   You may see Riley Lam, MD or one of the following Advanced Practice Providers on your designated Care Team:    Ronie Spies, PA-C  Jacolyn Reedy, PA-C          Signed, Christell Constant, MD  08/23/2020 12:35 PM    [facility]

## 2020-08-23 NOTE — Patient Instructions (Signed)
Medication Instructions:  Your physician has recommended you make the following change in your medication:  INCREASE: rosuvastatin to 20 mg by mouth daily  *If you need a refill on your cardiac medications before your next appointment, please call your pharmacy*   Lab Work: IN 3 MONTHS: LFT and FLP If you have labs (blood work) drawn today and your tests are completely normal, you will receive your results only by: Marland Kitchen MyChart Message (if you have MyChart) OR . A paper copy in the mail If you have any lab test that is abnormal or we need to change your treatment, we will call you to review the results.   Testing/Procedures: NONE   Follow-Up: At [facility], you and your health needs are our priority.  As part of our continuing mission to provide you with exceptional heart care, we have created designated Provider Care Teams.  These Care Teams include your primary Cardiologist (physician) and Advanced Practice Providers (APPs -  Physician Assistants and Nurse Practitioners) who all work together to provide you with the care you need, when you need it.  We recommend signing up for the patient portal called "MyChart".  Sign up information is provided on this After Visit Summary.  MyChart is used to connect with patients for Virtual Visits (Telemedicine).  Patients are able to view lab/test results, encounter notes, upcoming appointments, etc.  Non-urgent messages can be sent to your provider as well.   To learn more about what you can do with MyChart, go to [email or web address].    Your next appointment:   12 month(s)  The format for your next appointment:   In Person  Provider:   You may see Riley Lam, MD or one of the following Advanced Practice Providers on your designated Care Team:    Ronie Spies, PA-C  Jacolyn Reedy, PA-C

## 2020-10-15 ENCOUNTER — Telehealth: Payer: Self-pay

## 2020-10-15 DIAGNOSIS — Z006 Encounter for examination for normal comparison and control in clinical research program: Secondary | ICD-10-CM

## 2020-10-15 NOTE — Telephone Encounter (Signed)
I have attempted without success to contact this patient by phone for her Identify 90 day follow up phone call. I left a message for patient to return my phone call with my name and callback number.  

## 2020-11-27 ENCOUNTER — Other Ambulatory Visit: Payer: 59 | Admitting: *Deleted

## 2020-11-27 ENCOUNTER — Other Ambulatory Visit: Payer: Self-pay

## 2020-11-27 LAB — HEPATIC FUNCTION PANEL
ALT: 18 IU/L (ref 0–32)
AST: 19 IU/L (ref 0–40)
Albumin: 4.1 g/dL (ref 3.8–4.8)
Alkaline Phosphatase: 100 IU/L (ref 44–121)
Bilirubin Total: 0.2 mg/dL (ref 0.0–1.2)
Bilirubin, Direct: <0.1 mg/dL (ref 0.00–0.40)
Total Protein: 7.2 g/dL (ref 6.0–8.5)

## 2020-11-27 LAB — LIPID PANEL
Chol/HDL Ratio: 2.6 ratio (ref 0.0–4.4)
Cholesterol, Total: 148 mg/dL (ref 100–199)
HDL: 56 mg/dL (ref >39)
LDL Chol Calc (NIH): 71 mg/dL (ref 0–99)
Triglycerides: 119 mg/dL (ref 0–149)
VLDL Cholesterol Cal: 21 mg/dL (ref 5–40)

## 2020-11-30 ENCOUNTER — Telehealth: Payer: Self-pay | Admitting: Internal Medicine

## 2020-11-30 DIAGNOSIS — Z79899 Other long term (current) drug therapy: Secondary | ICD-10-CM

## 2020-11-30 DIAGNOSIS — E7849 Other hyperlipidemia: Secondary | ICD-10-CM

## 2020-11-30 MED ORDER — ROSUVASTATIN CALCIUM 40 MG PO TABS: 40.0000 mg | ORAL_TABLET | Freq: Every day | ORAL | 3 refills | Status: DC

## 2020-11-30 NOTE — Telephone Encounter (Signed)
Follow Up:    Pt is returning call from [facility], concerning her lab results.

## 2020-11-30 NOTE — Telephone Encounter (Signed)
Results: Only slight improvement in cholesterol Plan: Confirm had 20 mg dose and has no issues; otherwise would increase to 40 mg andcheck labs in three months   Christell Constant, MD   Pt is aware of the above.   Reports she has been taking Crestor 20 mg daily.  Advised new RX will be sent into pharmacy of choice.  Lab ordered and scheduled.

## 2021-02-12 ENCOUNTER — Ambulatory Visit: Payer: 59 | Admitting: Podiatry

## 2021-02-21 ENCOUNTER — Other Ambulatory Visit: Payer: Self-pay

## 2021-02-21 ENCOUNTER — Encounter: Payer: Self-pay | Admitting: Podiatry

## 2021-02-21 ENCOUNTER — Ambulatory Visit (INDEPENDENT_AMBULATORY_CARE_PROVIDER_SITE_OTHER): Payer: 59

## 2021-02-21 ENCOUNTER — Ambulatory Visit (INDEPENDENT_AMBULATORY_CARE_PROVIDER_SITE_OTHER): Payer: 59 | Admitting: Podiatry

## 2021-02-21 DIAGNOSIS — M7661 Achilles tendinitis, right leg: Secondary | ICD-10-CM | POA: Diagnosis not present

## 2021-02-21 MED ORDER — MELOXICAM 15 MG PO TABS: 15.0000 mg | ORAL_TABLET | Freq: Every day | ORAL | 3 refills | Status: DC

## 2021-02-21 MED ORDER — METHYLPREDNISOLONE 4 MG PO TBPK: ORAL_TABLET | ORAL | 0 refills | Status: DC

## 2021-02-21 MED ORDER — DEXAMETHASONE SODIUM PHOSPHATE 120 MG/30ML IJ SOLN
2.0000 mg | Freq: Once | INTRAMUSCULAR | Status: AC
  Administered 2021-02-21: 2 mg via INTRA_ARTICULAR

## 2021-02-24 NOTE — Progress Notes (Signed)
She presents today for a chief concern of pain to the posterior aspect of her right heel.  States that is still distal right at the edge of the back and the bottom.  States this been going on now for 6 or 7 weeks denies any trauma other than stepping backwards and she felt a pull when she did that she has been trying ibuprofen but really nothing seems to make it better.  She is had this in the past and we have treated it.  Objective: Vital signs are stable she is alert and oriented x3.  Pulses are palpable.  Neurologic sensorium is intact Deetjen reflexes are intact muscle strength is normal and symmetrical.  She has good margins of the Achilles tendon in the right heel there is some fluctuance in the posterior inferior aspect of the calcaneus near the insertion of the Achilles.  She also has some mild tenderness on palpation of the medial calcaneal tubercle.  Radiographs taken today demonstrate retrocalcaneal heel spur with some intratendinous calcification posteriorly mild soft tissue increase in density of the plantar fashion calcaneal insertion site indicative of Planter fasciitis.  Assessment: Achilles tendinitis with bursitis primarily with some mild Planter fasciitis.  Plan: At this point I started her on methylprednisolone to be followed by meloxicam.  Also injected the bursa today with dexamethasone 2 mg to the point of maximal tenderness.  We did discuss sleeping in a night splint and icing the heel.

## 2021-03-20 ENCOUNTER — Other Ambulatory Visit: Payer: 59 | Admitting: *Deleted

## 2021-03-20 ENCOUNTER — Other Ambulatory Visit: Payer: Self-pay

## 2021-03-20 DIAGNOSIS — Z79899 Other long term (current) drug therapy: Secondary | ICD-10-CM

## 2021-03-20 DIAGNOSIS — E7849 Other hyperlipidemia: Secondary | ICD-10-CM

## 2021-03-20 LAB — HEPATIC FUNCTION PANEL
ALT: 23 IU/L (ref 0–32)
AST: 30 IU/L (ref 0–40)
Albumin: 4.6 g/dL (ref 3.8–4.8)
Alkaline Phosphatase: 87 IU/L (ref 44–121)
Bilirubin Total: 0.5 mg/dL (ref 0.0–1.2)
Bilirubin, Direct: 0.17 mg/dL (ref 0.00–0.40)
Total Protein: 7.5 g/dL (ref 6.0–8.5)

## 2021-03-20 LAB — LIPID PANEL
Chol/HDL Ratio: 2.3 ratio (ref 0.0–4.4)
Cholesterol, Total: 141 mg/dL (ref 100–199)
HDL: 62 mg/dL (ref >39)
LDL Chol Calc (NIH): 65 mg/dL (ref 0–99)
Triglycerides: 71 mg/dL (ref 0–149)
VLDL Cholesterol Cal: 14 mg/dL (ref 5–40)

## 2021-03-26 ENCOUNTER — Other Ambulatory Visit: Payer: 59

## 2021-04-04 ENCOUNTER — Other Ambulatory Visit: Payer: Self-pay

## 2021-04-04 ENCOUNTER — Ambulatory Visit: Payer: 59 | Admitting: Podiatry

## 2021-04-04 DIAGNOSIS — M7661 Achilles tendinitis, right leg: Secondary | ICD-10-CM | POA: Diagnosis not present

## 2021-04-04 DIAGNOSIS — M722 Plantar fascial fibromatosis: Secondary | ICD-10-CM | POA: Diagnosis not present

## 2021-04-04 NOTE — Progress Notes (Signed)
She presents today for follow-up of her right Achilles tendinosis.  She states that it got better for a little while but regressed quickly after walking doing some shopping and walking [place] the [place].  She states that it is now just painful all the time never regressing back down to complete pain level that she was at but still not good.  Objective: Exquisitely painful on palpation of the large nonpulsatile mass to the posterior aspect of the right heel moderate erythema no cellulitis drainage or odor no open lesions or wounds.  Assessment: Chronic most likely a tear probable split tear of the Achilles and the fascia at their insertion sites.  Plan: I will follow-up with her once her MRI is complete this is for surgical and differential diagnoses.

## 2021-04-17 ENCOUNTER — Telehealth: Payer: Self-pay | Admitting: *Deleted

## 2021-04-17 NOTE — Telephone Encounter (Signed)
Contacted [facility], giving approval information, patient is scheduled for 04/23/21@5 :40 pm.

## 2021-04-17 NOTE — Telephone Encounter (Signed)
P2P completed -   Approval - [id number] 45 days expiration from today

## 2021-04-17 NOTE — Telephone Encounter (Signed)
Morrie Sheldon w/ [facility] is calling to notify physician that a peer to peer is required for MRI Ankle right foot w/o contrast denied.Case #[id number],opt. 3

## 2021-04-23 ENCOUNTER — Ambulatory Visit
Admission: RE | Admit: 2021-04-23 | Discharge: 2021-04-23 | Disposition: A | Discharge status: Home / Self Care | Payer: 59 | Source: Ambulatory Visit | Attending: Podiatry | Admitting: Podiatry

## 2021-04-23 ENCOUNTER — Other Ambulatory Visit: Payer: Self-pay

## 2021-04-23 DIAGNOSIS — M722 Plantar fascial fibromatosis: Secondary | ICD-10-CM

## 2021-04-23 DIAGNOSIS — M7661 Achilles tendinitis, right leg: Secondary | ICD-10-CM

## 2021-05-02 ENCOUNTER — Ambulatory Visit (INDEPENDENT_AMBULATORY_CARE_PROVIDER_SITE_OTHER): Payer: 59 | Admitting: Podiatry

## 2021-05-02 ENCOUNTER — Encounter: Payer: Self-pay | Admitting: Podiatry

## 2021-05-02 DIAGNOSIS — M7731 Calcaneal spur, right foot: Secondary | ICD-10-CM

## 2021-05-02 DIAGNOSIS — M898X9 Other specified disorders of bone, unspecified site: Secondary | ICD-10-CM | POA: Diagnosis not present

## 2021-05-02 DIAGNOSIS — M7661 Achilles tendinitis, right leg: Secondary | ICD-10-CM | POA: Diagnosis not present

## 2021-05-02 DIAGNOSIS — M722 Plantar fascial fibromatosis: Secondary | ICD-10-CM | POA: Diagnosis not present

## 2021-05-02 NOTE — Progress Notes (Signed)
She presents today for follow-up of her MRI of her right foot.  States that is really the same if not worse.  States that is time for the need to get this thing fixed I think.  She denies any change in her past medical history medications allergies surgeries and social history.  She denies any allergic reactions to any medications.  No allergic reactions to anesthesia.  Objective: Vital signs are stable alert and oriented x3.  She has severe pain on palpation dorsal aspect of the right foot for a large TM exostosis is present.  She also has a large Haglund's deformity and posterior exostosis as well as chronic pain on palpation medial calcaneal tubercle.  She also has per MRI partial tears of the Achilles tendon at its insertion site.  Assessment: Chronic Achilles tendinitis and Planter fasciitis as well as pain to the dorsal aspect of the right foot secondary to osteoarthritic changes.  Plan: Discussed etiology pathology conservative surgical therapies at this point she agreed that that she needs to undergo surgery to have this corrected.  She would like to go ahead and have the dorsal tarsal exostectomy performed as well otherwise were going to do a gastroc recession and retrocalcaneal heel spur resection and an Achilles tenolysis.  We discussed the possible postop complications and side effects which may include but not limited to postop pain bleeding swelling infection recurrence need for further surgery overcorrection under correction loss of digit loss of limb loss of life.  She will also receive a nonweightbearing cast which time I instructed her to obtain crutches as well as a knee scooter and she will do so.  We will do this after the first of the year and we provided her with information regarding the surgery center and anesthesia group should she have questions or concerns she will notify us immediately.

## 2021-05-23 ENCOUNTER — Telehealth: Payer: Self-pay | Admitting: Urology

## 2021-05-23 NOTE — Telephone Encounter (Signed)
DOS - 06/14/21  EPF RIGHT --- 02637 REPAIR ACHILLES TENDON RIGHT --- 85885 GASTROCNEMIUS RECESS RIGHT --- 02774 CALCANEAL OSTECTOMY RIGHT --- 12878 EXOSTECTOMY RIGHT --- 28108   [facility] EFFECTIVE DATE - 06/02/14   PER [employer] WEB SITE FOR CPT CODES 67672, 09470, 96283, 66294 AND 28108 HAVE BEEN APPROVED, AUTH # [id number], GOOD FROM 06/14/21 - 09/12/21

## 2021-05-28 DIAGNOSIS — M79676 Pain in unspecified toe(s): Secondary | ICD-10-CM

## 2021-06-04 DIAGNOSIS — M79676 Pain in unspecified toe(s): Secondary | ICD-10-CM

## 2021-06-12 ENCOUNTER — Other Ambulatory Visit: Payer: Self-pay | Admitting: Podiatry

## 2021-06-12 MED ORDER — ONDANSETRON HCL 4 MG PO TABS: 4.0000 mg | ORAL_TABLET | Freq: Three times a day (TID) | ORAL | 0 refills | Status: DC | PRN

## 2021-06-12 MED ORDER — OXYCODONE-ACETAMINOPHEN 10-325 MG PO TABS: 1.0000 | ORAL_TABLET | Freq: Three times a day (TID) | ORAL | 0 refills | Status: AC | PRN

## 2021-06-12 MED ORDER — CEPHALEXIN 500 MG PO CAPS: 500.0000 mg | ORAL_CAPSULE | Freq: Three times a day (TID) | ORAL | 0 refills | Status: DC

## 2021-06-14 DIAGNOSIS — M25774 Osteophyte, right foot: Secondary | ICD-10-CM | POA: Diagnosis not present

## 2021-06-14 DIAGNOSIS — M216X1 Other acquired deformities of right foot: Secondary | ICD-10-CM | POA: Diagnosis not present

## 2021-06-14 DIAGNOSIS — M722 Plantar fascial fibromatosis: Secondary | ICD-10-CM | POA: Diagnosis not present

## 2021-06-14 DIAGNOSIS — M7661 Achilles tendinitis, right leg: Secondary | ICD-10-CM | POA: Diagnosis not present

## 2021-06-14 DIAGNOSIS — M7731 Calcaneal spur, right foot: Secondary | ICD-10-CM | POA: Diagnosis not present

## 2021-06-20 ENCOUNTER — Ambulatory Visit (INDEPENDENT_AMBULATORY_CARE_PROVIDER_SITE_OTHER): Payer: 59

## 2021-06-20 ENCOUNTER — Ambulatory Visit (INDEPENDENT_AMBULATORY_CARE_PROVIDER_SITE_OTHER): Payer: 59 | Admitting: Podiatry

## 2021-06-20 ENCOUNTER — Other Ambulatory Visit: Payer: Self-pay

## 2021-06-20 DIAGNOSIS — M7661 Achilles tendinitis, right leg: Secondary | ICD-10-CM

## 2021-06-20 DIAGNOSIS — M722 Plantar fascial fibromatosis: Secondary | ICD-10-CM | POA: Diagnosis not present

## 2021-06-20 DIAGNOSIS — Z9889 Other specified postprocedural states: Secondary | ICD-10-CM

## 2021-06-20 DIAGNOSIS — M7731 Calcaneal spur, right foot: Secondary | ICD-10-CM

## 2021-06-20 MED ORDER — TRIAMCINOLONE ACETONIDE 40 MG/ML IJ SUSP
20.0000 mg | Freq: Once | INTRAMUSCULAR | Status: AC
  Administered 2021-06-20: 20 mg

## 2021-06-20 NOTE — Progress Notes (Signed)
She presents today postop visit date of surgery 06/14/2021 status post gastroc recession Achilles tendon repair heel spur resection total endoscopic plantar fasciotomy and exostectomy to the dorsum of the foot.  She denies fever chills nausea vomiting muscle aches pains she does state that she had a cough for the first few days but she is doing much better now.  Did state 1 day of nausea.  Objective: Presents today vital signs stable alert oriented x3 cast is intact clean on the bottom.  She has good sensation to the toes mild numbness between the first and second toe otherwise she has good range of motion of the toes good sensation and it is loose at the top of the cast.  Radiographs taken today demonstrate complete resection dorsal aspect of the spurs as well as the posterior aspect of spurs.  Assessment well-healing surgical foot.  Plan: She remain in the cast today she will follow-up on February 2 for a cast removal and a new cast application Staples will remain posteriorly.

## 2021-06-25 ENCOUNTER — Other Ambulatory Visit: Payer: Self-pay

## 2021-06-25 ENCOUNTER — Ambulatory Visit (INDEPENDENT_AMBULATORY_CARE_PROVIDER_SITE_OTHER): Payer: 59 | Admitting: Podiatry

## 2021-06-25 ENCOUNTER — Encounter: Payer: Self-pay | Admitting: Podiatry

## 2021-06-25 DIAGNOSIS — M7661 Achilles tendinitis, right leg: Secondary | ICD-10-CM | POA: Diagnosis not present

## 2021-06-25 DIAGNOSIS — Z9889 Other specified postprocedural states: Secondary | ICD-10-CM

## 2021-06-25 DIAGNOSIS — M7731 Calcaneal spur, right foot: Secondary | ICD-10-CM

## 2021-06-25 DIAGNOSIS — M722 Plantar fascial fibromatosis: Secondary | ICD-10-CM

## 2021-06-25 NOTE — Progress Notes (Signed)
She presents today date of surgery June 14, 2021 status post gastroc recession and Achilles tenolysis and a resection retrocalcaneal heel spur as well as a total endoscopic plantar fasciotomy.  She also had a cast on.  She denies fever chills nausea vomiting muscle aches pains calf pain back pain chest pain shortness of breath.  Objective: She presents today nonambulatory utilizing her knee scooter with her husband in a cast that is dry and clean and does not demonstrate any ambulation.  Once the cast was removed today dressed a compressive dressing was intact and was removed demonstrates staples are intact margins well coapted she does have a bit of an eschar at the distalmost aspect of the Achilles incision site.  Otherwise everything else appears to be healing very nicely.  Assessment: Well-healing surgical foot.  Plan: Redressed the foot and leg today and placed another below-knee cast.  She will remain nonweightbearing and keeping it elevated is much as possible we will follow-up with her in 2 weeks at which time we will remove the cast remove all of the staples and stitches and put her in a cam boot.

## 2021-07-09 ENCOUNTER — Encounter: Payer: 59 | Admitting: Podiatry

## 2021-07-11 ENCOUNTER — Encounter: Payer: Self-pay | Admitting: Podiatry

## 2021-07-11 ENCOUNTER — Other Ambulatory Visit: Payer: Self-pay

## 2021-07-11 ENCOUNTER — Ambulatory Visit (INDEPENDENT_AMBULATORY_CARE_PROVIDER_SITE_OTHER): Payer: 59 | Admitting: Podiatry

## 2021-07-11 DIAGNOSIS — M722 Plantar fascial fibromatosis: Secondary | ICD-10-CM

## 2021-07-11 DIAGNOSIS — M7661 Achilles tendinitis, right leg: Secondary | ICD-10-CM | POA: Diagnosis not present

## 2021-07-11 DIAGNOSIS — Z9889 Other specified postprocedural states: Secondary | ICD-10-CM

## 2021-07-11 DIAGNOSIS — M7731 Calcaneal spur, right foot: Secondary | ICD-10-CM

## 2021-07-11 NOTE — Progress Notes (Signed)
She presents today date of surgery June 14, 2021 status post gastroc recession and Achilles tenolysis and a resection retrocalcaneal heel spur as well as a total endoscopic plantar fasciotomy with cast application. She denies fever chills nausea vomiting muscle aches pains calf pain back pain chest pain shortness of breath. She feels the cast has been fairly loose on the heel and not having too much pain.  Objective: She presents today nonambulatory utilizing her knee scooter with her husband in a cast that is dry and clean and does not demonstrate any ambulation.    Once the cast was removed today, sutures and staples were removed. Incision margins stayed intact. No signs of infection. Everything appears to be healing very nicely.  Assessment: Well-healing surgical foot.  Dispensed an anklet and a cam boot with instructions for wearing. She will remain nonweightbearing. Advised she could wash the foot lightly in 24 hours. She will follow up in 2 weeks.

## 2021-07-23 ENCOUNTER — Other Ambulatory Visit: Payer: Self-pay

## 2021-07-23 ENCOUNTER — Ambulatory Visit (INDEPENDENT_AMBULATORY_CARE_PROVIDER_SITE_OTHER): Payer: 59 | Admitting: Podiatry

## 2021-07-23 DIAGNOSIS — Z9889 Other specified postprocedural states: Secondary | ICD-10-CM

## 2021-07-23 DIAGNOSIS — M7731 Calcaneal spur, right foot: Secondary | ICD-10-CM

## 2021-07-23 DIAGNOSIS — M722 Plantar fascial fibromatosis: Secondary | ICD-10-CM

## 2021-07-23 DIAGNOSIS — M7661 Achilles tendinitis, right leg: Secondary | ICD-10-CM

## 2021-07-23 NOTE — Progress Notes (Signed)
She presents today with her husband date of surgery January 13 2013th right foot gastroc recession and Achilles tenolysis dorsal tarsal exostosis and endoscopic fasciotomy with cast.  She denies fever chills nausea vomiting states that the pain has decreased considerably she states that incision is looking better and still has a small scab on the back she says.  She continues to be nonweightbearing in her cam walker.  Overall she states that she is pleased and is doing better.  Objective: Vital signs are stable she is alert oriented x3 she denies fever chills nausea vomit muscle aches pains calf pain back pain chest pain shortness of breath.  Pulses are palpable once the cast was removed demonstrating no erythema to some mild edema no cellulitis drainage or odor she has good range of motion of the toes she has good plantarflexion against resistance she has small eschar overlying the distalmost aspect of the incision which is turning loose.  I think this is going be okay without leaving an open wound.  I see no signs of infection.  Assessment: Well-healing surgical foot right.  Plan: All incision sites have gone on to heal medially laterally and dorsally waiting for the last 1 to heal I expressed to her to put tape over it so she use her compression anklet that it would not rub it.  She is going to continue the use of her cam walker but were going to start partial weightbearing and continuing onto full weightbearing and we are going to continue her out of work for another month from March 30.  If I can get her walking and back into a tennis shoe and this eschar off of her heel I will let her go back to work immediately.  I will follow-up with her in 2 weeks

## 2021-07-30 ENCOUNTER — Encounter: Payer: Self-pay | Admitting: Podiatry

## 2021-08-06 ENCOUNTER — Ambulatory Visit (INDEPENDENT_AMBULATORY_CARE_PROVIDER_SITE_OTHER): Payer: 59 | Admitting: Podiatry

## 2021-08-06 ENCOUNTER — Other Ambulatory Visit: Payer: Self-pay

## 2021-08-06 ENCOUNTER — Encounter: Payer: Self-pay | Admitting: Podiatry

## 2021-08-06 DIAGNOSIS — M7661 Achilles tendinitis, right leg: Secondary | ICD-10-CM | POA: Diagnosis not present

## 2021-08-06 DIAGNOSIS — T8189XA Other complications of procedures, not elsewhere classified, initial encounter: Secondary | ICD-10-CM

## 2021-08-06 DIAGNOSIS — M7731 Calcaneal spur, right foot: Secondary | ICD-10-CM

## 2021-08-06 DIAGNOSIS — R059 Cough, unspecified: Secondary | ICD-10-CM | POA: Insufficient documentation

## 2021-08-06 DIAGNOSIS — M722 Plantar fascial fibromatosis: Secondary | ICD-10-CM | POA: Diagnosis not present

## 2021-08-06 DIAGNOSIS — M79604 Pain in right leg: Secondary | ICD-10-CM | POA: Insufficient documentation

## 2021-08-06 DIAGNOSIS — Z9889 Other specified postprocedural states: Secondary | ICD-10-CM

## 2021-08-06 DIAGNOSIS — Z0000 Encounter for general adult medical examination without abnormal findings: Secondary | ICD-10-CM | POA: Insufficient documentation

## 2021-08-06 DIAGNOSIS — K5730 Diverticulosis of large intestine without perforation or abscess without bleeding: Secondary | ICD-10-CM | POA: Insufficient documentation

## 2021-08-06 DIAGNOSIS — K5900 Constipation, unspecified: Secondary | ICD-10-CM | POA: Insufficient documentation

## 2021-08-06 DIAGNOSIS — K580 Irritable bowel syndrome with diarrhea: Secondary | ICD-10-CM | POA: Insufficient documentation

## 2021-08-06 DIAGNOSIS — E7800 Pure hypercholesterolemia, unspecified: Secondary | ICD-10-CM | POA: Insufficient documentation

## 2021-08-06 NOTE — Progress Notes (Signed)
She presents today for follow-up of her Achilles tendinitis and her gastroc recession with them Achilles tenolysis and EPF and exostectomy dorsally.  Everything seems to be healing well with exception of the distalmost aspect of the wound posteriorly.  She states that her ankle is starting to feel little bit weak.  She denies fever chills nausea vomiting muscle aches pains calf pain back pain chest pain shortness of breath date of surgery June 14, 2021. ? ?Objective: Vital signs are stable she is alert and oriented x3 presents with her cam walker and a single crutch today partial weightbearing/full weightbearing.  Once the dressing was removed which was a large Band-Aid demonstrates a small scab distally measuring less than 2 cm in total length.  There is a small area of granulation tissue no purulence no malodor associated with it.  She has good plantarflexion against resistance. ? ?Assessment: Well-healing surgical foot.  Slight delay in wound healing distal. ? ?Plan: Discussed etiology pathology conservative versus surgical therapies.  At this point I highly recommended that she clean this wound daily and apply a small amount of antibiotic ointment and a dressing.  I also recommended that she utilize a shoe in the house with her crutch that has an open heel on it so we do not continue to put pressure on this wound.  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  I will follow-up with her in about 2 to 3 weeks ?

## 2021-08-22 ENCOUNTER — Ambulatory Visit (INDEPENDENT_AMBULATORY_CARE_PROVIDER_SITE_OTHER): Payer: 59 | Admitting: Podiatry

## 2021-08-22 ENCOUNTER — Encounter: Payer: Self-pay | Admitting: Podiatry

## 2021-08-22 ENCOUNTER — Other Ambulatory Visit: Payer: Self-pay

## 2021-08-22 DIAGNOSIS — M7661 Achilles tendinitis, right leg: Secondary | ICD-10-CM

## 2021-08-22 DIAGNOSIS — T8189XD Other complications of procedures, not elsewhere classified, subsequent encounter: Secondary | ICD-10-CM | POA: Diagnosis not present

## 2021-08-22 DIAGNOSIS — M722 Plantar fascial fibromatosis: Secondary | ICD-10-CM

## 2021-08-22 DIAGNOSIS — Z9889 Other specified postprocedural states: Secondary | ICD-10-CM

## 2021-08-22 DIAGNOSIS — M7731 Calcaneal spur, right foot: Secondary | ICD-10-CM

## 2021-08-22 NOTE — Progress Notes (Signed)
She presents today date of surgery 06/14/2021 right foot gastroc recession Achilles tenolysis heel spur resection and total EPF exostectomy and application of cast.  She denies fever chills nausea vomiting muscle aches and pains.  She states that she is been wearing flip-flops and sandals around the house of the back of the heel rub.  States that there is still an ulcer there but is much smaller.  She is happy with the outcome thus far.  States that she will be Retiring from her job in 3 and half months. ? ?Objective: Vital signs are stable she is alert and oriented x3.  Pulses are palpable.  There is no erythema there is some mild edema no cellulitis drainage or odor no signs of infection.  The eschar once debrided is debrided down to approximately 0.8 cm in total length and similar width.  There is 1 small area just superior to that measures about 3 mm in diameter with some fibrin deposition. ? ?Assessment well-healing surgical foot delayed wound healing otherwise. ? ?Plan I debrided all of the necrotic tissue and dry thick skin today to open this up so that it can heal I put a small amount of silver nitrate on it and then a bandage.  She is to keep the pressure off of this and I will follow-up with her in 2 weeks at which time we hope she will be able to return to work.  However if the wound is still open she cannot wear closed shoes and for her job I think she needs to be enclosed shoes. ?

## 2021-09-03 ENCOUNTER — Encounter: Payer: Self-pay | Admitting: Podiatry

## 2021-09-03 ENCOUNTER — Ambulatory Visit (INDEPENDENT_AMBULATORY_CARE_PROVIDER_SITE_OTHER): Payer: 59 | Admitting: Podiatry

## 2021-09-03 DIAGNOSIS — M722 Plantar fascial fibromatosis: Secondary | ICD-10-CM

## 2021-09-03 DIAGNOSIS — M7731 Calcaneal spur, right foot: Secondary | ICD-10-CM

## 2021-09-03 DIAGNOSIS — Z9889 Other specified postprocedural states: Secondary | ICD-10-CM

## 2021-09-03 DIAGNOSIS — T8189XD Other complications of procedures, not elsewhere classified, subsequent encounter: Secondary | ICD-10-CM

## 2021-09-03 DIAGNOSIS — M7661 Achilles tendinitis, right leg: Secondary | ICD-10-CM

## 2021-09-03 NOTE — Progress Notes (Signed)
She presents today for follow-up of her surgery June 14, 2021 right foot.  She had a gastroc resection Achilles tenolysis retrocalcaneal heel spur resection and total EPF with cast.  Currently she states that she still has a scab overlying a small wound.  But seems to be getting better.  She states that she went online short walk the other day and can really feel the stress in the posterior ankle. ? ?Objective: Vital signs are stable she alert oriented x3 much decrease in edema no erythema cellulitis drainage or odor she does have a scab on the posterior aspect of the surgical incision was debrided today and leaves only a very small less than 1 cm in length scab.  This is still attached to the fibrin base.  But currently the wound that was present last visit is no longer present.  That has gone on to heal. ? ?Assessment well-healing surgical foot wound healing is delaying her recovery and going back to work. ? ?Plan: At this point ongoing debride the wound again today I am going to redress it and suggest that she do the same on a daily basis.  We will have to keep her out of work for another 2 weeks she is not able to go back to work with the wound in a hospital setting.  She will follow-up with her in 2 weeks hopefully if we heal as much over the next week as we did the last 2 then we will be in good shape. ?

## 2021-09-17 ENCOUNTER — Ambulatory Visit (INDEPENDENT_AMBULATORY_CARE_PROVIDER_SITE_OTHER): Payer: 59 | Admitting: Podiatry

## 2021-09-17 ENCOUNTER — Encounter: Payer: Self-pay | Admitting: Podiatry

## 2021-09-17 DIAGNOSIS — M778 Other enthesopathies, not elsewhere classified: Secondary | ICD-10-CM | POA: Diagnosis not present

## 2021-09-17 DIAGNOSIS — T8189XD Other complications of procedures, not elsewhere classified, subsequent encounter: Secondary | ICD-10-CM

## 2021-09-17 DIAGNOSIS — M722 Plantar fascial fibromatosis: Secondary | ICD-10-CM

## 2021-09-17 DIAGNOSIS — Z9889 Other specified postprocedural states: Secondary | ICD-10-CM

## 2021-09-17 DIAGNOSIS — M7731 Calcaneal spur, right foot: Secondary | ICD-10-CM | POA: Diagnosis not present

## 2021-09-17 DIAGNOSIS — M7661 Achilles tendinitis, right leg: Secondary | ICD-10-CM | POA: Diagnosis not present

## 2021-09-17 MED ORDER — TRIAMCINOLONE ACETONIDE 40 MG/ML IJ SUSP
20.0000 mg | Freq: Once | INTRAMUSCULAR | Status: AC
  Administered 2021-09-17: 20 mg

## 2021-09-17 MED ORDER — CELECOXIB 100 MG PO CAPS: 100.0000 mg | ORAL_CAPSULE | Freq: Two times a day (BID) | ORAL | 1 refills | Status: DC

## 2021-09-17 NOTE — Progress Notes (Signed)
She presents today date of surgery June 14, 2021 for a retrocalcaneal heel spur resection Achilles tenolysis dorsal tarsal exostectomy.  States that he seems to be doing pretty well and there is a very small scab still present.  Walking she states does pretty good there is dorsal lateral aspect of her right foot is somewhat tender.  She thinks it may be due to the shoes as well. ? ?Objective: Vital signs are stable she is alert and oriented x3 there is no erythema minimal edema no cellulitis drainage or odor with a small eschar that was remaining on the posterior aspect of the incision has gone on to heal uneventfully and I removed it today.  She has good plantarflexion and dorsiflexion against resistance.  Dorsal aspect of the right foot is warm to the touch but no erythema.  This is overlying the area where there is considerable osteoarthritic change in that midfoot. ? 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  She also has well-healing surgical foot. ? ?Plan: Allow her to go back to work I am going to put her on Celebrex 100 mg 1 p.o. twice daily and I will follow-up with her in 6 weeks.  Call sooner if needed ?

## 2021-11-05 ENCOUNTER — Ambulatory Visit (INDEPENDENT_AMBULATORY_CARE_PROVIDER_SITE_OTHER): Payer: 59 | Admitting: Podiatry

## 2021-11-05 ENCOUNTER — Encounter: Payer: Self-pay | Admitting: Podiatry

## 2021-11-05 DIAGNOSIS — M7731 Calcaneal spur, right foot: Secondary | ICD-10-CM | POA: Diagnosis not present

## 2021-11-05 DIAGNOSIS — M7661 Achilles tendinitis, right leg: Secondary | ICD-10-CM

## 2021-11-05 DIAGNOSIS — M722 Plantar fascial fibromatosis: Secondary | ICD-10-CM | POA: Diagnosis not present

## 2021-11-05 DIAGNOSIS — Z9889 Other specified postprocedural states: Secondary | ICD-10-CM

## 2021-11-05 DIAGNOSIS — T8189XD Other complications of procedures, not elsewhere classified, subsequent encounter: Secondary | ICD-10-CM

## 2021-11-05 NOTE — Progress Notes (Signed)
She presents today date of surgery June 14, 2021 right foot gastroc recession Achilles tendon repair heel spur resection and a total endoscopic plantar fasciotomy with exostectomy.  She also had a application of a below-knee cast.  She is here today for wound check states that the scab has come off of the back but is still tender back there.  She states that she still has to be careful how she steps because it is quite weak back there.  Objective: Vital signs are stable she is alert and oriented x3 she has good dorsiflexion plantarflexion against resistance is strong no pain on palpation of the Achilles the only pain that she has is where the distalmost aspect of her incision has scarred down to the calcaneus.  Assessment well-healing surgical foot.  Plan: We discussed that this is probably will take a year or so before all the swelling and scar tissue has resolved.  I offered her physical therapy but she declined thing that she we will do this at home with her husband massaging.  She will call me with questions or concerns otherwise I will follow-up with her in about 2 months and she should be retired by that time.

## 2021-12-22 NOTE — Progress Notes (Unsigned)
Cardiology Office Note:    Date:  12/24/2021   ID:  Whitney [name], DOB 11-Apr-1956, MRN [id number]  PCP:  Pearson Grippe, MD  [facility] Cardiologist: Riley Lam MD [facility] Electrophysiologist:  None   CC: Cardiac MI Prevention  History of Present Illness:    Whitney [name] is a 66 y.o. female with a hx of HLD and prior TIA, OSA on CPAP, Morbid Obesity who presents for evaluation 05/03/20.  2022: patient had normal echo and Aortic atherosclerosis on CT.   Patient notes that she is doing well post retirement.   Since last visit notes went camping up [place]. There are no interval hospital/ED visit.   Had surgery on her foot; dealing with trigger, is having residual R knee pain from using scooter.  No chest pain or pressure .  No SOB/DOE and no PND/Orthopnea.  No weight gain or leg swelling.  No palpitations or syncope.  Walking has been limited because of the pool.  Ambulatory blood pressure elevated prior to tooth extraction 170 mm Hg. Sometimes gets as high as 140 mm Hg.   Past Medical History:  Diagnosis Date   Allergy    Arthritis    Chest pain    Cough    Depression    GERD (gastroesophageal reflux disease)    Hayfever    Hyperlipidemia    IBS (irritable bowel syndrome)    Obesity    OSA (obstructive sleep apnea)    Pure hypercholesterolemia    Right leg pain    TIA (transient ischemic attack)     Past Surgical History:  Procedure Laterality Date   CHOLECYSTECTOMY     DILATION AND CURETTAGE OF UTERUS     FOOT SURGERY      Current Medications: Current Meds  Medication Sig   aspirin 81 MG tablet Take 81 mg by mouth daily.   Azelastine HCl 137 MCG/SPRAY SOLN    Cholecalciferol (VITAMIN D3) 2000 UNITS TABS Take by mouth.   fexofenadine (ALLEGRA) 60 MG tablet Take 60 mg by mouth 2 (two) times daily.   fluticasone (FLONASE) 50 MCG/ACT nasal spray    magnesium oxide (MAG-OX) 400 MG tablet Take 400 mg by mouth daily.   montelukast  (SINGULAIR) 10 MG tablet Take 10 mg by mouth daily.   Multiple Vitamin (MULTI VITAMIN DAILY PO) Take by mouth daily.    RESTASIS 0.05 % ophthalmic emulsion    rosuvastatin (CRESTOR) 40 MG tablet Take 1 tablet (40 mg total) by mouth daily.   sertraline (ZOLOFT) 50 MG tablet Take 50 mg by mouth daily.     Allergies:   Patient has no known allergies.   Social History   Socioeconomic History   Marital status: Married    Spouse name: Not on file   Number of children: Not on file   Years of education: Not on file   Highest education level: Not on file  Occupational History   Not on file  Tobacco Use   Smoking status: Never   Smokeless tobacco: Never  Vaping Use   Vaping Use: Never used  Substance and Sexual Activity   Alcohol use: No   Drug use: No   Sexual activity: Not on file  Other Topics Concern   Not on file  Social History Narrative   Not on file   Social Determinants of Health   Financial Resource Strain: Not on file  Food Insecurity: Not on file  Transportation Needs: Not on file  Physical Activity: Not  on file  Stress: Not on file  Social Connections: Not on file   CC: From [facility] Retired  Family History: The patient's family history includes Alzheimer's disease in her mother; Cerebral palsy in her daughter; Cirrhosis in her father; Diabetes type II in her father; Hyperlipidemia in her father. Denies family history of sudden cardiac death including drowning, car accidents, or unexplained deaths in the family. No history of non-ischemic cardiomyopathies including hypertrophic cardiomyopathy, left ventricular non-compaction, or arrhythmogenic right ventricular cardiomyopathy. No history of bicuspid aortic valve or aortic aneurysm or dissection. History of coronary artery disease notable for no CAD. History of arrhythmia notable for no rhythm issues.  ROS:   Please see the history of present illness.    All other systems reviewed and are  negative.  EKGs/Labs/Other Studies Reviewed:    The following studies were reviewed today:  EKG:   12/24/21: SR low voltage QRS rate70 05/03/20 SR 66 with artifactual baseline 06/26/2014: SR 78 WNL  Transthoracic Echocardiogram: Date: 05/29/21 Results:  1. Left ventricular ejection fraction, by estimation, is 60 to 65%. The  left ventricle has normal function. The left ventricle has no regional  wall motion abnormalities. Left ventricular diastolic parameters are  consistent with Grade I diastolic  dysfunction (impaired relaxation).   2. Right ventricular systolic function is normal. The right ventricular  size is normal.   3. The mitral valve is normal in structure. No evidence of mitral valve  regurgitation. No evidence of mitral stenosis.   4. The aortic valve is normal in structure. Aortic valve regurgitation is  not visualized. No aortic stenosis is present.   5. The inferior vena cava is normal in size with greater than 50%  respiratory variability, suggesting right atrial pressure of 3 mmHg.    CardiacCT : Date: 05/31/20 Results: IMPRESSION: 1. Coronary calcium score of 34.4. This was 76th percentile for age, sex, and race matched control.   2. Normal coronary origin.  Right dominance.   3. Ascending Aortic Atherosclerosis noted.   4. Mild non-obstructive CAD (25-49%) noted in LAD. Consider non-atherosclerotic causes of chest pain. Consider preventive therapy and risk factor modification.   Recent Labs: 03/20/2021: ALT 23  Recent Lipid Panel    Component Value Date/Time   CHOL 141 03/20/2021 0738   TRIG 71 03/20/2021 0738   HDL 62 03/20/2021 0738   CHOLHDL 2.3 03/20/2021 0738   LDLCALC 65 03/20/2021 0738    Physical Exam:    VS:  BP 122/67   Pulse 70   Ht 5' 4.5" (1.638 m)   Wt 250 lb (113.4 kg)   SpO2 96%   BMI 42.25 kg/m     Wt Readings from Last 3 Encounters:  12/24/21 250 lb (113.4 kg)  08/23/20 231 lb 6.4 oz (105 kg)  05/03/20 237 lb  (107.5 kg)    Gen: No distress Morbid obesity  Neck: No JVD Ears: No Homero Fellers Sign Cardiac: No Rubs or Gallops, No murmur, RRR + radial pulses Respiratory: Clear to auscultation bilaterally, normal effort, normal  respiratory rate GI: Soft, nontender, non-distended  MS: No  edema; moves all extremities Integument: Skin feels warms Neuro:  At time of evaluation, alert and oriented to person/place/time/situation Psych: Normal affect, patient feels well   ASSESSMENT:    1. Morbid obesity (HCC)   2. Aortic atherosclerosis (HCC)   3. Coronary artery calcification   4. OSA on CPAP     PLAN:    Morbid Obesity with HLD Aortic atherosclerosis and CAC  OSA with CPAP - LDL 35 2023 - on ASA mg po Daily  - Statin 40 mg PO daily - if AMB increases to persistently SBP 140, we will start lisinopril 5mg  and check two week f/u labs, she will then re-establish with me - we have discussed dietary changes (the big home pizza on Sundays), starting in her new above ground pool   PRN follow up       Medication Adjustments/Labs and Tests Ordered: Current medicines are reviewed at length with the patient today.  Concerns regarding medicines are outlined above.  Orders Placed This Encounter  Procedures   EKG 12-Lead   No orders of the defined types were placed in this encounter.   Patient Instructions  Medication Instructions:  Your physician recommends that you continue on your current medications as directed. Please refer to the Current Medication list given to you today.  *If you need a refill on your cardiac medications before your next appointment, please call your pharmacy*   Lab Work: NONE If you have labs (blood work) drawn today and your tests are completely normal, you will receive your results only by: MyChart Message (if you have MyChart) OR A paper copy in the mail If you have any lab test that is abnormal or we need to change your treatment, we will call you to review the  results.   Testing/Procedures: NONE   Follow-Up:As needed At [facility], you and your health needs are our priority.  As part of our continuing mission to provide you with exceptional heart care, we have created designated Provider Care Teams.  These Care Teams include your primary Cardiologist (physician) and Advanced Practice Providers (APPs -  Physician Assistants and Nurse Practitioners) who all work together to provide you with the care you need, when you need it.   Provider:   [facility], MD   Important Information About Sugar         Signed, Riley Lam, MD  12/24/2021 9:44 AM    [facility]

## 2021-12-24 ENCOUNTER — Other Ambulatory Visit: Payer: Self-pay

## 2021-12-24 ENCOUNTER — Ambulatory Visit (INDEPENDENT_AMBULATORY_CARE_PROVIDER_SITE_OTHER): Payer: 59 | Admitting: Internal Medicine

## 2021-12-24 ENCOUNTER — Encounter: Payer: Self-pay | Admitting: Internal Medicine

## 2021-12-24 ENCOUNTER — Encounter: Payer: Self-pay | Admitting: Podiatry

## 2021-12-24 DIAGNOSIS — Z9989 Dependence on other enabling machines and devices: Secondary | ICD-10-CM

## 2021-12-24 DIAGNOSIS — G4733 Obstructive sleep apnea (adult) (pediatric): Secondary | ICD-10-CM | POA: Diagnosis not present

## 2021-12-24 DIAGNOSIS — I2510 Atherosclerotic heart disease of native coronary artery without angina pectoris: Secondary | ICD-10-CM

## 2021-12-24 DIAGNOSIS — I700 Atherosclerosis of aorta: Secondary | ICD-10-CM | POA: Diagnosis not present

## 2021-12-24 DIAGNOSIS — I2584 Coronary atherosclerosis due to calcified coronary lesion: Secondary | ICD-10-CM

## 2021-12-24 MED ORDER — ROSUVASTATIN CALCIUM 40 MG PO TABS: 40.0000 mg | ORAL_TABLET | Freq: Every day | ORAL | 3 refills | Status: DC

## 2021-12-24 NOTE — Patient Instructions (Signed)
Medication Instructions:  ?Your physician recommends that you continue on your current medications as directed. Please refer to the Current Medication list given to you today. ? ?*If you need a refill on your cardiac medications before your next appointment, please call your pharmacy* ? ? ?Lab Work: ?NONE ?If you have labs (blood work) drawn today and your tests are completely normal, you will receive your results only by: ?MyChart Message (if you have MyChart) OR ?A paper copy in the mail ?If you have any lab test that is abnormal or we need to change your treatment, we will call you to review the results. ? ? ?Testing/Procedures: ?NONE ? ? ?Follow-Up: As needed ?At [facility], you and your health needs are our priority.  As part of our continuing mission to provide you with exceptional heart care, we have created designated Provider Care Teams.  These Care Teams include your primary Cardiologist (physician) and Advanced Practice Providers (APPs -  Physician Assistants and Nurse Practitioners) who all work together to provide you with the care you need, when you need it. ? ? ?Provider:   ?Mahesh Chandrasekhar, MD ? ? ? ?Important Information About Sugar ? ? ? ? ?  ?

## 2021-12-25 ENCOUNTER — Other Ambulatory Visit: Payer: Self-pay

## 2021-12-26 ENCOUNTER — Other Ambulatory Visit: Payer: Self-pay

## 2021-12-26 DIAGNOSIS — M7661 Achilles tendinitis, right leg: Secondary | ICD-10-CM

## 2021-12-26 NOTE — Telephone Encounter (Signed)
Referral was put in for PT

## 2022-01-06 ENCOUNTER — Ambulatory Visit (INDEPENDENT_AMBULATORY_CARE_PROVIDER_SITE_OTHER): Payer: 59 | Admitting: Physical Therapy

## 2022-01-06 ENCOUNTER — Encounter: Payer: Self-pay | Admitting: Physical Therapy

## 2022-01-06 DIAGNOSIS — R262 Difficulty in walking, not elsewhere classified: Secondary | ICD-10-CM

## 2022-01-06 DIAGNOSIS — M6281 Muscle weakness (generalized): Secondary | ICD-10-CM | POA: Diagnosis not present

## 2022-01-06 DIAGNOSIS — M25571 Pain in right ankle and joints of right foot: Secondary | ICD-10-CM

## 2022-01-06 NOTE — Therapy (Signed)
OUTPATIENT PHYSICAL THERAPY LOWER EXTREMITY EVALUATION   Patient Name: Whitney [name] MRN: [id number] DOB:05/19/1956, 66 y.o., female Today's Date: 01/06/2022   PT End of Session - 01/06/22 1640     Visit Number 1    Number of Visits 6    Date for PT Re-Evaluation 02/21/22    PT Start Time 1345    PT Stop Time 1426    PT Time Calculation (min) 41 min    Activity Tolerance Patient tolerated treatment well    Behavior During Therapy [facility] for tasks assessed/performed             Past Medical History:  Diagnosis Date   Allergy    Arthritis    Chest pain    Cough    Depression    GERD (gastroesophageal reflux disease)    Hayfever    Hyperlipidemia    IBS (irritable bowel syndrome)    Obesity    OSA (obstructive sleep apnea)    Pure hypercholesterolemia    Right leg pain    TIA (transient ischemic attack)    Past Surgical History:  Procedure Laterality Date   CHOLECYSTECTOMY     DILATION AND CURETTAGE OF UTERUS     FOOT SURGERY     Patient Active Problem List   Diagnosis Date Noted   Aortic atherosclerosis ([place]) 12/24/2021   Coronary artery calcification 12/24/2021   Constipation 08/06/2021   Cough 08/06/2021   Diverticular disease of colon 08/06/2021   Encounter for general adult medical examination without abnormal findings 08/06/2021   Irritable bowel syndrome with diarrhea 08/06/2021   Pain in right leg 08/06/2021   Pure hypercholesterolemia 08/06/2021   Hyperlipidemia 08/30/2014   GERD (gastroesophageal reflux disease) 08/30/2014   Morbid obesity ([place]) 08/30/2014   Obstructive sleep apnea 08/30/2014    PCP: Jani Gravel, MD  REFERRING PROVIDER:  Garrel Ridgel, DPM  REFERRING DIAG: [phone] (ICD-10-CM) - Achilles tendinitis of right lower extremity  THERAPY DIAG:  Pain in right ankle and joints of right foot  Muscle weakness (generalized)  Difficulty in walking, not elsewhere classified  Rationale for Evaluation and Treatment  Rehabilitation  ONSET DATE: Rt ankle surgery June 14, 2021  SUBJECTIVE:   SUBJECTIVE STATEMENT: Pt stating she was on a scooter for 6 weeks following surgery and now she feels like her Rt knee is limiting her walking more than the ankle. Pt stating pressure or rubbing over incision site hurts. Pt states standing and walking can aggravate it at times.   PERTINENT HISTORY: She presents today date of surgery June 14, 2021 right foot gastroc recession Achilles tendon repair heel spur resection and a total endoscopic plantar fasciotomy with exostectomy.   Depression, GERD, hyperlipidemia, obesity, depression, sleep apnea, TIA  PAIN:  Are you having pain? Yes: NPRS scale: 2//10 Pain location: Rt ankle, posterior heel Pain description: sore, stiffness Aggravating factors: walking longer distances, standing prolonged Relieving factors: resting  PRECAUTIONS: None  WEIGHT BEARING RESTRICTIONS No  FALLS:  Has patient fallen in last 6 months? No  LIVING ENVIRONMENT: Lives with: lives with their family and lives with their spouse Lives in: House/apartment StairsYes: External: 5 steps; bilateral but cannot reach both:  Has following equipment at home: None  OCCUPATION: retired, RT  PLOF: Independent  PATIENT GOALS:    OBJECTIVE:     PATIENT SURVEYS:  01/06/22: FOTO 55%, (predicted 63%)  COGNITION:  Overall cognitive status: Within functional limits for tasks assessed     SENSATION: [facility]  POSTURE: rounded shoulders and forward head  PALPATION: 01/06/22: TTP along incision sites, posterior and distal achilles  LOWER EXTREMITY ROM:  Active ROM Right 01/06/22 Left 01/06/22  Knee flexion    Knee extension    Ankle dorsiflexion 5 10  Ankle plantarflexion 35 55  Ankle inversion 22 27  Ankle eversion 22 30   (Blank rows = not tested)  LOWER EXTREMITY MMT:  MMT Right 01/06/22 Left 01/06/22:   Knee flexion    Knee extension    Ankle dorsiflexion 4/5 5/5  Ankle  plantarflexion 4/5 5/5  Ankle inversion 3/5 5/5  Ankle eversion 3/5 5/5   (Blank rows = not tested)   FUNCTIONAL TESTS:  01/06/22: 5 time sit to stand 20 seconds with no UE support  GAIT: 01/06/22:  Distance walked: 30 feet,  Assistive device utilized: None Level of assistance: Complete Independence Comments: mild antalgic gait     TODAY'S TREATMENT: 01/06/22:  HEP instruction/performance c cues for techniques, handout provided.  Trial set performed of each for comprehension and symptom assessment.  See below for exercise list.   PATIENT EDUCATION:  Education details: PT POC, HEP Person educated: Patient Education method: Explanation, Demonstration, Tactile cues, Verbal cues, and Handouts Education comprehension: verbalized understanding and returned demonstration   HOME EXERCISE PROGRAM: Access Code: [email or web address] URL: [email or web address] Date: 01/06/2022 Prepared by: Narda Amber  Exercises - Seated Ankle Alphabet  - 2-3 x daily - 7 x weekly - 1 reps - Towel Scrunches  - 2-3 x daily - 7 x weekly - 5 reps - Heel Raises with Counter Support  - 2-3 x daily - 7 x weekly - 2 sets - 10 reps - Ankle Inversion Eversion Towel Slide  - 2-3 x daily - 7 x weekly - 2 sets - 10 reps - Standing Gastroc Stretch on Foam 1/2 Roll  - 2-3 x daily - 7 x weekly - 3 reps - 20-30 seconds hold  ASSESSMENT:  CLINICAL IMPRESSION: Patient is a 66 y.o. who comes to clinic with complaints of Rt ankle/heel pain due to surgery on June 14, 2021 on right foot gastroc recession c Achilles tendon repair heel spur resection and a total endoscopic plantar fasciotomy with exostectomy.  Pt presents with mobility, strength and movement coordination deficits that impair their ability to perform usual daily and recreational functional activities without increase difficulty/symptoms.  Patient to benefit from skilled PT services to address impairments and limitations to improve to previous level of  function without restriction secondary to condition.    OBJECTIVE IMPAIRMENTS decreased balance, difficulty walking, decreased ROM, decreased strength, increased edema, impaired flexibility, and pain.   ACTIVITY LIMITATIONS bending, standing, squatting, stairs, and transfers  PARTICIPATION LIMITATIONS: community activity  PERSONAL FACTORS Depression, GERD, hyperlipidemia, obesity, depression, sleep apnea, TIA are also affecting patient's functional outcome.   REHAB POTENTIAL: Good  CLINICAL DECISION MAKING: Stable/uncomplicated  EVALUATION COMPLEXITY: Low   GOALS: Goals reviewed with patient? Yes  SHORT TERM GOALS: Target date: 01/20/2022  Patient will demonstrate independent use of initial home exercise program to maintain progress from in clinic treatments. Goal status: New    LONG TERM GOALS: Target date: 02/17/2022  Patient will demonstrate/report pain at worst less than or equal to 2/10 to facilitate minimal limitation in daily activity secondary to pain symptoms. Goal status: New   Patient will demonstrate independent use of home exercise program to facilitate ability to maintain/progress functional gains from skilled physical therapy services. Goal status: New   Patient will demonstrate FOTO outcome >  or = 63 % to indicate reduced disability due to condition. Goal status: New   Pt will be able to amb community distances for 20 minutes with no pain reported in Rt ankle.  Goal status: New       5.  Pt will improve her Rt ankle strength to >/= 5/5 for improved functional mobility.   Goal status: New    PLAN: PT FREQUENCY: 1x/week  PT DURATION: 6 weeks  PLANNED INTERVENTIONS: Therapeutic exercises, Therapeutic activity, Neuromuscular re-education, Balance training, Gait training, Patient/Family education, Self Care, Joint mobilization, Dry Needling, Electrical stimulation, Cryotherapy, Taping, Vasopneumatic device, Ultrasound, and Manual therapy  PLAN FOR NEXT  SESSION: achilles stretching, begin 4 way ankle band exercises, BAPS board, standing balance   Sharmon Leyden, PT, MPT 01/06/2022, 4:42 PM

## 2022-01-07 ENCOUNTER — Encounter: Payer: 59 | Admitting: Podiatry

## 2022-01-13 ENCOUNTER — Ambulatory Visit (INDEPENDENT_AMBULATORY_CARE_PROVIDER_SITE_OTHER): Payer: 59 | Admitting: Physical Therapy

## 2022-01-13 ENCOUNTER — Encounter: Payer: Self-pay | Admitting: Physical Therapy

## 2022-01-13 DIAGNOSIS — R262 Difficulty in walking, not elsewhere classified: Secondary | ICD-10-CM | POA: Diagnosis not present

## 2022-01-13 DIAGNOSIS — M25571 Pain in right ankle and joints of right foot: Secondary | ICD-10-CM

## 2022-01-13 DIAGNOSIS — M6281 Muscle weakness (generalized): Secondary | ICD-10-CM | POA: Diagnosis not present

## 2022-01-13 NOTE — Therapy (Signed)
OUTPATIENT PHYSICAL THERAPY TREATMENT NOTE   Patient Name: Whitney [name] MRN: [id number] DOB:[birth year], 66 y.o., female Today's Date: 01/13/2022   END OF SESSION:   PT End of Session - 01/13/22 1345     Visit Number 2    Number of Visits 6    Date for PT Re-Evaluation 02/21/22    PT Start Time 1346    PT Stop Time 1425    PT Time Calculation (min) 39 min    Activity Tolerance Patient tolerated treatment well    Behavior During Therapy WFL for tasks assessed/performed             Past Medical History:  Diagnosis Date   Allergy    Arthritis    Chest pain    Cough    Depression    GERD (gastroesophageal reflux disease)    Hayfever    Hyperlipidemia    IBS (irritable bowel syndrome)    Obesity    OSA (obstructive sleep apnea)    Pure hypercholesterolemia    Right leg pain    TIA (transient ischemic attack)    Past Surgical History:  Procedure Laterality Date   CHOLECYSTECTOMY     DILATION AND CURETTAGE OF UTERUS     FOOT SURGERY     Patient Active Problem List   Diagnosis Date Noted   Aortic atherosclerosis ([place]) 12/24/2021   Coronary artery calcification 12/24/2021   Constipation 08/06/2021   Cough 08/06/2021   Diverticular disease of colon 08/06/2021   Encounter for general adult medical examination without abnormal findings 08/06/2021   Irritable bowel syndrome with diarrhea 08/06/2021   Pain in right leg 08/06/2021   Pure hypercholesterolemia 08/06/2021   Hyperlipidemia 08/30/2014   GERD (gastroesophageal reflux disease) 08/30/2014   Morbid obesity ([place]) 08/30/2014   Obstructive sleep apnea 08/30/2014     THERAPY DIAG:  Pain in right ankle and joints of right foot  Muscle weakness (generalized)  Difficulty in walking, not elsewhere classified   PCP: Jani Gravel, MD  REFERRING PROVIDER:  Garrel Ridgel, DPM  REFERRING DIAG: [phone] (ICD-10-CM) - Achilles tendinitis of right lower extremity  Rationale for Evaluation and Treatment  Rehabilitation  ONSET DATE: Rt ankle surgery June 14, 2021  SUBJECTIVE:   SUBJECTIVE STATEMENT: Her back is a little flared up for about a week, standing is the biggest aggravator.  Rt ankle is doing pretty well.   PERTINENT HISTORY: She presents today date of surgery June 14, 2021 right foot gastroc recession Achilles tendon repair heel spur resection and a total endoscopic plantar fasciotomy with exostectomy.   Depression, GERD, hyperlipidemia, obesity, depression, sleep apnea, TIA  PAIN:  Are you having pain? Yes: NPRS scale: 0/10; low back 2-3/10 Pain location: Rt ankle, posterior heel Pain description: sore, stiffness Aggravating factors: walking longer distances, standing prolonged Relieving factors: resting  PRECAUTIONS: None  WEIGHT BEARING RESTRICTIONS No  FALLS:  Has patient fallen in last 6 months? No  LIVING ENVIRONMENT: Lives with: lives with their family and lives with their spouse Lives in: House/apartment StairsYes: External: 5 steps; bilateral but cannot reach both:  Has following equipment at home: None  OCCUPATION: retired, RT  PLOF: Independent  PATIENT GOALS:  improve pain   OBJECTIVE:   PATIENT SURVEYS:  01/06/22: FOTO 55%, (predicted 63%)  PALPATION: 01/06/22: TTP along incision sites, posterior and distal achilles  LOWER EXTREMITY ROM:  Active ROM Right 01/06/22 Left 01/06/22  Knee flexion    Knee extension    Ankle dorsiflexion 5 10  Ankle plantarflexion 35 55  Ankle inversion 22 27  Ankle eversion 22 30   (Blank rows = not tested)  LOWER EXTREMITY MMT:  MMT Right 01/06/22 Left 01/06/22:   Knee flexion    Knee extension    Ankle dorsiflexion 4/5 5/5  Ankle plantarflexion 4/5 5/5  Ankle inversion 3/5 5/5  Ankle eversion 3/5 5/5   (Blank rows = not tested)   FUNCTIONAL TESTS:  01/06/22: 5 time sit to stand 20 seconds with no UE support  GAIT: 01/06/22:  Distance walked: 30 feet,  Assistive device utilized: None Level of  assistance: Complete Independence Comments: mild antalgic gait     TODAY'S TREATMENT: 01/13/22 Therex: NuStep L6 x 6 min Slantboard stretch 3x60 sec Seated alphabet A-Z x 1 rep; Rt ankle Towel scrunch x 15 reps Inv/Ev with towel on Rt 2x10 reps Seated ankle 4-way 2 x 20 reps, L3 band Seated Rt calf raise with 8# DB 3x10 SLS on foam with intermittent UE support 5x20 sec; RLE   01/06/22:  HEP instruction/performance c cues for techniques, handout provided.  Trial set performed of each for comprehension and symptom assessment.  See below for exercise list.   PATIENT EDUCATION:  Education details: PT POC, HEP Person educated: Patient Education method: Explanation, Demonstration, Tactile cues, Verbal cues, and Handouts Education comprehension: verbalized understanding and returned demonstration   HOME EXERCISE PROGRAM: Access Code: [email or web address] URL: [email or web address] Date: 01/13/2022 Prepared by: Faustino Congress  Exercises - Seated Ankle Alphabet  - 2-3 x daily - 7 x weekly - 1 reps - Towel Scrunches  - 2-3 x daily - 7 x weekly - 5 reps - Heel Raises with Counter Support  - 2-3 x daily - 7 x weekly - 2 sets - 10 reps - Ankle Inversion Eversion Towel Slide  - 2-3 x daily - 7 x weekly - 2 sets - 10 reps - Standing Gastroc Stretch on Foam 1/2 Roll  - 2-3 x daily - 7 x weekly - 3 reps - 20-30 seconds hold - Seated Ankle Eversion with Resistance  - 1-2 x daily - 7 x weekly - 1-2 sets - 20 reps - Seated Ankle Dorsiflexion with Resistance  - 1-2 x daily - 7 x weekly - 1-2 sets - 20 reps - Seated Ankle Plantar Flexion with Resistance Loop  - 1-2 x daily - 7 x weekly - 1-2 sets - 20 reps - Seated Ankle Inversion with Resistance  - 1-2 x daily - 7 x weekly - 1-2 sets - 20 reps  ASSESSMENT:  CLINICAL IMPRESSION: Pt tolerated session well today and able to progress to band exercises, provided for home.  Has difficulty with SLS activities so will benefit from continued  balance activities.   OBJECTIVE IMPAIRMENTS decreased balance, difficulty walking, decreased ROM, decreased strength, increased edema, impaired flexibility, and pain.   ACTIVITY LIMITATIONS bending, standing, squatting, stairs, and transfers  PARTICIPATION LIMITATIONS: community activity  PERSONAL FACTORS Depression, GERD, hyperlipidemia, obesity, depression, sleep apnea, TIA are also affecting patient's functional outcome.   REHAB POTENTIAL: Good  CLINICAL DECISION MAKING: Stable/uncomplicated  EVALUATION COMPLEXITY: Low   GOALS: Goals reviewed with patient? Yes  SHORT TERM GOALS: Target date: 01/20/2022  Patient will demonstrate independent use of initial home exercise program to maintain progress from in clinic treatments. Goal status: MET 01/13/22    LONG TERM GOALS: Target date: 02/17/2022  Patient will demonstrate/report pain at worst less than or equal to 2/10 to facilitate minimal limitation in daily activity secondary to pain  symptoms. Goal status: New   Patient will demonstrate independent use of home exercise program to facilitate ability to maintain/progress functional gains from skilled physical therapy services. Goal status: New   Patient will demonstrate FOTO outcome > or = 63 % to indicate reduced disability due to condition. Goal status: New   Pt will be able to amb community distances for 20 minutes with no pain reported in Rt ankle.  Goal status: New       5.  Pt will improve her Rt ankle strength to >/= 5/5 for improved functional mobility.   Goal status: New    PLAN: PT FREQUENCY: 1x/week  PT DURATION: 6 weeks  PLANNED INTERVENTIONS: Therapeutic exercises, Therapeutic activity, Neuromuscular re-education, Balance training, Gait training, Patient/Family education, Self Care, Joint mobilization, Dry Needling, Electrical stimulation, Cryotherapy, Taping, Vasopneumatic device, Ultrasound, and Manual therapy  PLAN FOR NEXT SESSION: achilles stretching,  review 4 way ankle band exercises, BAPS board, standing balance (SLS activities)     Laureen Abrahams, PT, DPT 01/13/22 2:29 PM

## 2022-01-20 ENCOUNTER — Ambulatory Visit (INDEPENDENT_AMBULATORY_CARE_PROVIDER_SITE_OTHER): Payer: 59 | Admitting: Physical Therapy

## 2022-01-20 ENCOUNTER — Encounter: Payer: Self-pay | Admitting: Physical Therapy

## 2022-01-20 DIAGNOSIS — R262 Difficulty in walking, not elsewhere classified: Secondary | ICD-10-CM

## 2022-01-20 DIAGNOSIS — M25571 Pain in right ankle and joints of right foot: Secondary | ICD-10-CM

## 2022-01-20 DIAGNOSIS — M6281 Muscle weakness (generalized): Secondary | ICD-10-CM

## 2022-01-20 NOTE — Therapy (Signed)
OUTPATIENT PHYSICAL THERAPY TREATMENT NOTE   Patient Name: TECORA [name] MRN: [id number] DOB:[birth year], 66 y.o., female Today's Date: 01/20/2022   END OF SESSION:   PT End of Session - 01/20/22 1627     Visit Number 3    Number of Visits 6    Date for PT Re-Evaluation 02/21/22    PT Start Time 1347    PT Stop Time 1430    PT Time Calculation (min) 43 min    Activity Tolerance Patient tolerated treatment well    Behavior During Therapy WFL for tasks assessed/performed              Past Medical History:  Diagnosis Date   Allergy    Arthritis    Chest pain    Cough    Depression    GERD (gastroesophageal reflux disease)    Hayfever    Hyperlipidemia    IBS (irritable bowel syndrome)    Obesity    OSA (obstructive sleep apnea)    Pure hypercholesterolemia    Right leg pain    TIA (transient ischemic attack)    Past Surgical History:  Procedure Laterality Date   CHOLECYSTECTOMY     DILATION AND CURETTAGE OF UTERUS     FOOT SURGERY     Patient Active Problem List   Diagnosis Date Noted   Aortic atherosclerosis ([place]) 12/24/2021   Coronary artery calcification 12/24/2021   Constipation 08/06/2021   Cough 08/06/2021   Diverticular disease of colon 08/06/2021   Encounter for general adult medical examination without abnormal findings 08/06/2021   Irritable bowel syndrome with diarrhea 08/06/2021   Pain in right leg 08/06/2021   Pure hypercholesterolemia 08/06/2021   Hyperlipidemia 08/30/2014   GERD (gastroesophageal reflux disease) 08/30/2014   Morbid obesity ([place]) 08/30/2014   Obstructive sleep apnea 08/30/2014     THERAPY DIAG:  Pain in right ankle and joints of right foot  Muscle weakness (generalized)  Difficulty in walking, not elsewhere classified   PCP: Jani Gravel, MD  REFERRING PROVIDER:  Garrel Ridgel, DPM  REFERRING DIAG: [phone] (ICD-10-CM) - Achilles tendinitis of right lower extremity  Rationale for Evaluation and Treatment  Rehabilitation  ONSET DATE: Rt ankle surgery June 14, 2021  SUBJECTIVE:   SUBJECTIVE STATEMENT: Pt arriving with 1/10 pain in her Rt ankle. Pt stating her back still bothers her at times.    PERTINENT HISTORY: She presents today date of surgery June 14, 2021 right foot gastroc recession Achilles tendon repair heel spur resection and a total endoscopic plantar fasciotomy with exostectomy.   Depression, GERD, hyperlipidemia, obesity, depression, sleep apnea, TIA  PAIN:  Are you having pain? Yes: NPRS scale: 1/10 Rt ankle Pain location: Rt ankle, posterior heel Pain description: sore, stiffness Aggravating factors: walking longer distances, standing prolonged Relieving factors: resting  PRECAUTIONS: None  WEIGHT BEARING RESTRICTIONS No  FALLS:  Has patient fallen in last 6 months? No  LIVING ENVIRONMENT: Lives with: lives with their family and lives with their spouse Lives in: House/apartment StairsYes: External: 5 steps; bilateral but cannot reach both:  Has following equipment at home: None  OCCUPATION: retired, RT  PLOF: Independent  PATIENT GOALS:  improve pain   OBJECTIVE:   PATIENT SURVEYS:  01/06/22: FOTO 55%, (predicted 63%)  PALPATION: 01/06/22: TTP along incision sites, posterior and distal achilles  LOWER EXTREMITY ROM:  Active ROM Right 01/06/22 Left 01/06/22   Knee flexion     Knee extension     Ankle dorsiflexion 5 10  Ankle plantarflexion 35 55   Ankle inversion 22 27   Ankle eversion 22 30    (Blank rows = not tested)  LOWER EXTREMITY MMT:  MMT Right 01/06/22 Left 01/06/22:   Knee flexion    Knee extension    Ankle dorsiflexion 4/5 5/5  Ankle plantarflexion 4/5 5/5  Ankle inversion 3/5 5/5  Ankle eversion 3/5 5/5   (Blank rows = not tested)   FUNCTIONAL TESTS:  01/06/22: 5 time sit to stand 20 seconds with no UE support  GAIT: 01/06/22:  Distance walked: 30 feet,  Assistive device utilized: None Level of assistance: Complete  Independence Comments: mild antalgic gait     TODAY'S TREATMENT: 01/20/22 Therex: Bike Level 3 x 6 min Slantboard stretch 3x60 sec Heel Raises/toe raises x 20  Seated ankle 4-way 2 x 20 reps, L3 band BAPS board Level 3 x 2 minutes each direction sitting SLS on Airex: Rt LE x 3 holding 30 seconds Walking on Airex beam: x 8 with intermittent UE support in parallel bars   01/13/22 Therex: NuStep L6 x 6 min Slantboard stretch 3x60 sec Seated alphabet A-Z x 1 rep; Rt ankle Towel scrunch x 15 reps Inv/Ev with towel on Rt 2x10 reps Seated ankle 4-way 2 x 20 reps, L3 band Seated Rt calf raise with 8# DB 3x10 SLS on foam with intermittent UE support 5x20 sec; RLE   01/06/22:  HEP instruction/performance c cues for techniques, handout provided.  Trial set performed of each for comprehension and symptom assessment.  See below for exercise list.   PATIENT EDUCATION:  Education details: PT POC, HEP Person educated: Patient Education method: Explanation, Demonstration, Tactile cues, Verbal cues, and Handouts Education comprehension: verbalized understanding and returned demonstration   HOME EXERCISE PROGRAM: Access Code: [email or web address] URL: [email or web address] Date: 01/13/2022 Prepared by: Faustino Congress  Exercises - Seated Ankle Alphabet  - 2-3 x daily - 7 x weekly - 1 reps - Towel Scrunches  - 2-3 x daily - 7 x weekly - 5 reps - Heel Raises with Counter Support  - 2-3 x daily - 7 x weekly - 2 sets - 10 reps - Ankle Inversion Eversion Towel Slide  - 2-3 x daily - 7 x weekly - 2 sets - 10 reps - Standing Gastroc Stretch on Foam 1/2 Roll  - 2-3 x daily - 7 x weekly - 3 reps - 20-30 seconds hold - Seated Ankle Eversion with Resistance  - 1-2 x daily - 7 x weekly - 1-2 sets - 20 reps - Seated Ankle Dorsiflexion with Resistance  - 1-2 x daily - 7 x weekly - 1-2 sets - 20 reps - Seated Ankle Plantar Flexion with Resistance Loop  - 1-2 x daily - 7 x weekly - 1-2 sets - 20  reps - Seated Ankle Inversion with Resistance  - 1-2 x daily - 7 x weekly - 1-2 sets - 20 reps  ASSESSMENT:  CLINICAL IMPRESSION: Pt arriving to therapy reporting no pain at rest.   OBJECTIVE IMPAIRMENTS decreased balance, difficulty walking, decreased ROM, decreased strength, increased edema, impaired flexibility, and pain.   ACTIVITY LIMITATIONS bending, standing, squatting, stairs, and transfers  PARTICIPATION LIMITATIONS: community activity  PERSONAL FACTORS Depression, GERD, hyperlipidemia, obesity, depression, sleep apnea, TIA are also affecting patient's functional outcome.   REHAB POTENTIAL: Good  CLINICAL DECISION MAKING: Stable/uncomplicated  EVALUATION COMPLEXITY: Low   GOALS: Goals reviewed with patient? Yes  SHORT TERM GOALS: Target date: 01/20/2022  Patient will demonstrate independent use of initial  home exercise program to maintain progress from in clinic treatments. Goal status: MET 01/13/22    LONG TERM GOALS: Target date: 02/17/2022  Patient will demonstrate/report pain at worst less than or equal to 2/10 to facilitate minimal limitation in daily activity secondary to pain symptoms. Goal status: New   Patient will demonstrate independent use of home exercise program to facilitate ability to maintain/progress functional gains from skilled physical therapy services. Goal status: New   Patient will demonstrate FOTO outcome > or = 63 % to indicate reduced disability due to condition. Goal status: New   Pt will be able to amb community distances for 20 minutes with no pain reported in Rt ankle.  Goal status: New       5.  Pt will improve her Rt ankle strength to >/= 5/5 for improved functional mobility.   Goal status: New    PLAN: PT FREQUENCY: 1x/week  PT DURATION: 6 weeks  PLANNED INTERVENTIONS: Therapeutic exercises, Therapeutic activity, Neuromuscular re-education, Balance training, Gait training, Patient/Family education, Self Care, Joint  mobilization, Dry Needling, Electrical stimulation, Cryotherapy, Taping, Vasopneumatic device, Ultrasound, and Manual therapy  PLAN FOR NEXT SESSION:  take measurements next visit, achilles stretching, BAPS board, standing balance (SLS activities)     Kearney Hard, PT, MPT 01/20/22 4:29 PM   01/20/22 4:29 PM

## 2022-01-27 ENCOUNTER — Encounter: Payer: Self-pay | Admitting: Physical Therapy

## 2022-01-27 ENCOUNTER — Ambulatory Visit (INDEPENDENT_AMBULATORY_CARE_PROVIDER_SITE_OTHER): Payer: 59 | Admitting: Physical Therapy

## 2022-01-27 DIAGNOSIS — M6281 Muscle weakness (generalized): Secondary | ICD-10-CM

## 2022-01-27 DIAGNOSIS — R262 Difficulty in walking, not elsewhere classified: Secondary | ICD-10-CM

## 2022-01-27 DIAGNOSIS — M25571 Pain in right ankle and joints of right foot: Secondary | ICD-10-CM | POA: Diagnosis not present

## 2022-01-27 NOTE — Therapy (Signed)
OUTPATIENT PHYSICAL THERAPY TREATMENT NOTE   Patient Name: Whitney [name] MRN: [id number] DOB:July 26, 1955, 66 y.o., female Today's Date: 01/27/2022   END OF SESSION:   PT End of Session - 01/27/22 1440     Visit Number 4    Number of Visits 6    Date for PT Re-Evaluation 02/21/22    PT Start Time 6948    PT Stop Time 1438    PT Time Calculation (min) 42 min    Activity Tolerance Patient tolerated treatment well    Behavior During Therapy WFL for tasks assessed/performed               Past Medical History:  Diagnosis Date   Allergy    Arthritis    Chest pain    Cough    Depression    GERD (gastroesophageal reflux disease)    Hayfever    Hyperlipidemia    IBS (irritable bowel syndrome)    Obesity    OSA (obstructive sleep apnea)    Pure hypercholesterolemia    Right leg pain    TIA (transient ischemic attack)    Past Surgical History:  Procedure Laterality Date   CHOLECYSTECTOMY     DILATION AND CURETTAGE OF UTERUS     FOOT SURGERY     Patient Active Problem List   Diagnosis Date Noted   Aortic atherosclerosis ([place]) 12/24/2021   Coronary artery calcification 12/24/2021   Constipation 08/06/2021   Cough 08/06/2021   Diverticular disease of colon 08/06/2021   Encounter for general adult medical examination without abnormal findings 08/06/2021   Irritable bowel syndrome with diarrhea 08/06/2021   Pain in right leg 08/06/2021   Pure hypercholesterolemia 08/06/2021   Hyperlipidemia 08/30/2014   GERD (gastroesophageal reflux disease) 08/30/2014   Morbid obesity ([place]) 08/30/2014   Obstructive sleep apnea 08/30/2014     THERAPY DIAG:  Pain in right ankle and joints of right foot  Muscle weakness (generalized)  Difficulty in walking, not elsewhere classified   PCP: Jani Gravel, MD  REFERRING PROVIDER:  Garrel Ridgel, [place]  REFERRING DIAG: [phone] (ICD-10-CM) - Achilles tendinitis of right lower extremity  Rationale for Evaluation and Treatment  Rehabilitation  ONSET DATE: Rt ankle surgery June 14, 2021  SUBJECTIVE:   SUBJECTIVE STATEMENT: Pt arriving to therapy reporting no pain.    PERTINENT HISTORY: She presents today date of surgery June 14, 2021 right foot gastroc recession Achilles tendon repair heel spur resection and a total endoscopic plantar fasciotomy with exostectomy.   Depression, GERD, hyperlipidemia, obesity, depression, sleep apnea, TIA  PAIN:  Are you having pain? no Pain location: Rt ankle, posterior heel Pain description: sore, stiffness Aggravating factors: walking longer distances, standing prolonged Relieving factors: resting  PRECAUTIONS: None  WEIGHT BEARING RESTRICTIONS No  FALLS:  Has patient fallen in last 6 months? No  LIVING ENVIRONMENT: Lives with: lives with their family and lives with their spouse Lives in: House/apartment StairsYes: External: 5 steps; bilateral but cannot reach both:  Has following equipment at home: None  OCCUPATION: retired, RT  PLOF: Independent  PATIENT GOALS:  improve pain   OBJECTIVE:   PATIENT SURVEYS:  01/06/22: FOTO 55%, (predicted 63%)   PALPATION: 01/06/22: TTP along incision sites, posterior and distal achilles  LOWER EXTREMITY ROM:  Active ROM Right 01/06/22 Left 01/06/22 Rt 01/27/22 A: active P: passive  Knee flexion     Knee extension     Ankle dorsiflexion 5 10 A: 10 P: 14  Ankle plantarflexion 35 55 A:  48 P: 60  Ankle inversion 22 27 A: 28 P: 40  Ankle eversion 22 30 A: 26 P: 48   (Blank rows = not tested)  LOWER EXTREMITY MMT:  MMT Right 01/06/22 Left 01/06/22:  Rt 01/27/22  Knee flexion     Knee extension     Ankle dorsiflexion 4/5 5/5 5/5  Ankle plantarflexion 4/5 5/5 5/5  Ankle inversion 3/5 5/5 4/5  Ankle eversion 3/5 5/5 3+/5   (Blank rows = not tested)   FUNCTIONAL TESTS:  01/06/22: 5 time sit to stand 20 seconds with no UE support 01/27/22: 5 time sit to stand: 10 seconds with no UE support  GAIT: 01/06/22:   Distance walked: 30 feet,  Assistive device utilized: None Level of assistance: Complete Independence Comments: mild antalgic gait     TODAY'S TREATMENT: 01/27/22 Therex:  nustep Level 5 x 3 min LE only Slantboard stretch: gastroc: x 2 holding 20 sec, soleus x 2 holding 20 sec Heel Raises/toe raises x 20  Pushing off 6 inch step x 15 using Rt LE Leg Press: bil LE's 50# x 20  Leg Press Calf raises bil LE's 37# 3 x 10 Neuromuscular Re-edu BAPS board Level 2 x 2 minutes each direction standing c UE support SLS on Airex: Rt LE x 3 holding 30 seconds c UE finger tapping Sliding disc: toward 2 cones placed anterior/lateral and post/lateral x 10 c single UE support as needed Mini squats with UE support with towel roll under Rt midfoot    01/20/22 Therex: Bike Level 3 x 6 min Slantboard stretch 3x60 sec Heel Raises/toe raises x 20  Seated ankle 4-way 2 x 20 reps, L3 band BAPS board Level 3 x 2 minutes each direction sitting SLS on Airex: Rt LE x 3 holding 30 seconds Walking on Airex beam: x 8 with intermittent UE support in parallel bars   01/13/22 Therex: NuStep L6 x 6 min Slantboard stretch 3x60 sec Seated alphabet A-Z x 1 rep; Rt ankle Towel scrunch x 15 reps Inv/Ev with towel on Rt 2x10 reps Seated ankle 4-way 2 x 20 reps, L3 band Seated Rt calf raise with 8# DB 3x10 SLS on foam with intermittent UE support 5x20 sec; RLE      PATIENT EDUCATION:  Education details: PT POC, HEP Person educated: Patient Education method: Explanation, Demonstration, Tactile cues, Verbal cues, and Handouts Education comprehension: verbalized understanding and returned demonstration   HOME EXERCISE PROGRAM: Access Code: [email or web address] URL: [email or web address] Date: 01/13/2022 Prepared by: Faustino Congress  Exercises - Seated Ankle Alphabet  - 2-3 x daily - 7 x weekly - 1 reps - Towel Scrunches  - 2-3 x daily - 7 x weekly - 5 reps - Heel Raises with Counter Support  -  2-3 x daily - 7 x weekly - 2 sets - 10 reps - Ankle Inversion Eversion Towel Slide  - 2-3 x daily - 7 x weekly - 2 sets - 10 reps - Standing Gastroc Stretch on Foam 1/2 Roll  - 2-3 x daily - 7 x weekly - 3 reps - 20-30 seconds hold - Seated Ankle Eversion with Resistance  - 1-2 x daily - 7 x weekly - 1-2 sets - 20 reps - Seated Ankle Dorsiflexion with Resistance  - 1-2 x daily - 7 x weekly - 1-2 sets - 20 reps - Seated Ankle Plantar Flexion with Resistance Loop  - 1-2 x daily - 7 x weekly - 1-2 sets - 20 reps - Seated Ankle Inversion with  Resistance  - 1-2 x daily - 7 x weekly - 1-2 sets - 20 reps  ASSESSMENT:  CLINICAL IMPRESSION: Pt arriving to therapy reporting no pain at rest. Pt with improvements in Rt ankle strength and ROM. Still progressing with inversion and eversion strength and stability with single leg activities. Continue skilled PT to maximize pt's function.   OBJECTIVE IMPAIRMENTS decreased balance, difficulty walking, decreased ROM, decreased strength, increased edema, impaired flexibility, and pain.   ACTIVITY LIMITATIONS bending, standing, squatting, stairs, and transfers  PARTICIPATION LIMITATIONS: community activity  PERSONAL FACTORS Depression, GERD, hyperlipidemia, obesity, depression, sleep apnea, TIA are also affecting patient's functional outcome.   REHAB POTENTIAL: Good  CLINICAL DECISION MAKING: Stable/uncomplicated  EVALUATION COMPLEXITY: Low   GOALS: Goals reviewed with patient? Yes  SHORT TERM GOALS: Target date: 01/20/2022  Patient will demonstrate independent use of initial home exercise program to maintain progress from in clinic treatments. Goal status: MET 01/13/22    LONG TERM GOALS: Target date: 02/17/2022  Patient will demonstrate/report pain at worst less than or equal to 2/10 to facilitate minimal limitation in daily activity secondary to pain symptoms. Goal status: MET 01/27/22   Patient will demonstrate independent use of home exercise  program to facilitate ability to maintain/progress functional gains from skilled physical therapy services. Goal status: New   Patient will demonstrate FOTO outcome > or = 63 % to indicate reduced disability due to condition. Goal status: New   Pt will be able to amb community distances for 20 minutes with no pain reported in Rt ankle.  Goal status: MET 01/27/22       5.  Pt will improve her Rt ankle strength to >/= 5/5 for improved functional mobility.   Goal status: On-going 01/27/22    PLAN: PT FREQUENCY: 1x/week  PT DURATION: 6 weeks  PLANNED INTERVENTIONS: Therapeutic exercises, Therapeutic activity, Neuromuscular re-education, Balance training, Gait training, Patient/Family education, Self Care, Joint mobilization, Dry Needling, Electrical stimulation, Cryotherapy, Taping, Vasopneumatic device, Ultrasound, and Manual therapy  PLAN FOR NEXT SESSION:  achilles stretching, BAPS board, standing balance (SLS activities) Inversion and Eversion strengthening     Kearney Hard, PT, MPT 01/27/22 2:45 PM   01/27/22 2:45 PM

## 2022-02-04 ENCOUNTER — Ambulatory Visit (INDEPENDENT_AMBULATORY_CARE_PROVIDER_SITE_OTHER): Payer: 59 | Admitting: Physical Therapy

## 2022-02-04 ENCOUNTER — Encounter: Payer: Self-pay | Admitting: Physical Therapy

## 2022-02-04 DIAGNOSIS — M25571 Pain in right ankle and joints of right foot: Secondary | ICD-10-CM

## 2022-02-04 DIAGNOSIS — M6281 Muscle weakness (generalized): Secondary | ICD-10-CM | POA: Diagnosis not present

## 2022-02-04 DIAGNOSIS — R262 Difficulty in walking, not elsewhere classified: Secondary | ICD-10-CM

## 2022-02-04 NOTE — Therapy (Signed)
OUTPATIENT PHYSICAL THERAPY TREATMENT NOTE   Patient Name: Whitney [name] MRN: [id number] DOB:1956/02/04, 66 y.o., female Today's Date: 02/04/2022   END OF SESSION:   PT End of Session - 02/04/22 1351     Visit Number 5    Number of Visits 6    Date for PT Re-Evaluation 02/21/22    PT Start Time 6073    PT Stop Time 7106    PT Time Calculation (min) 40 min    Activity Tolerance Patient tolerated treatment well    Behavior During Therapy WFL for tasks assessed/performed                Past Medical History:  Diagnosis Date   Allergy    Arthritis    Chest pain    Cough    Depression    GERD (gastroesophageal reflux disease)    Hayfever    Hyperlipidemia    IBS (irritable bowel syndrome)    Obesity    OSA (obstructive sleep apnea)    Pure hypercholesterolemia    Right leg pain    TIA (transient ischemic attack)    Past Surgical History:  Procedure Laterality Date   CHOLECYSTECTOMY     DILATION AND CURETTAGE OF UTERUS     FOOT SURGERY     Patient Active Problem List   Diagnosis Date Noted   Aortic atherosclerosis ([place]) 12/24/2021   Coronary artery calcification 12/24/2021   Constipation 08/06/2021   Cough 08/06/2021   Diverticular disease of colon 08/06/2021   Encounter for general adult medical examination without abnormal findings 08/06/2021   Irritable bowel syndrome with diarrhea 08/06/2021   Pain in right leg 08/06/2021   Pure hypercholesterolemia 08/06/2021   Hyperlipidemia 08/30/2014   GERD (gastroesophageal reflux disease) 08/30/2014   Morbid obesity ([place]) 08/30/2014   Obstructive sleep apnea 08/30/2014     THERAPY DIAG:  Pain in right ankle and joints of right foot  Muscle weakness (generalized)  Difficulty in walking, not elsewhere classified   PCP: Jani Gravel, MD  REFERRING PROVIDER:  Garrel Ridgel, [place]  REFERRING DIAG: [phone] (ICD-10-CM) - Achilles tendinitis of right lower extremity  Rationale for Evaluation and Treatment  Rehabilitation  ONSET DATE: Rt ankle surgery June 14, 2021  SUBJECTIVE:   SUBJECTIVE STATEMENT: Pt arriving to therapy reporting no pain.    PERTINENT HISTORY: She presents today date of surgery June 14, 2021 right foot gastroc recession Achilles tendon repair heel spur resection and a total endoscopic plantar fasciotomy with exostectomy.   Depression, GERD, hyperlipidemia, obesity, depression, sleep apnea, TIA  PAIN:  Are you having pain? no Pain location: Rt ankle, posterior heel Pain description: sore, stiffness Aggravating factors: walking longer distances, standing prolonged Relieving factors: resting  PRECAUTIONS: None  WEIGHT BEARING RESTRICTIONS No  FALLS:  Has patient fallen in last 6 months? No  LIVING ENVIRONMENT: Lives with: lives with their family and lives with their spouse Lives in: House/apartment StairsYes: External: 5 steps; bilateral but cannot reach both:  Has following equipment at home: None  OCCUPATION: retired, RT  PLOF: Independent  PATIENT GOALS:  improve pain   OBJECTIVE:   PATIENT SURVEYS:  01/06/22: FOTO 55%, (predicted 63%)   PALPATION: 01/06/22: TTP along incision sites, posterior and distal achilles  LOWER EXTREMITY ROM:  Active ROM Right 01/06/22 Left 01/06/22 Rt 01/27/22 A: active P: passive Rt 02/04/22  Knee flexion      Knee extension      Ankle dorsiflexion 5 10 A: 10 P: 14 A:  10   Ankle plantarflexion 35 55 A: 48 P: 60 A: 48  Ankle inversion 22 27 A: 28 P: 40 A: 28  Ankle eversion 22 30 A: 26 P: 48 A: 26   (Blank rows = not tested)  LOWER EXTREMITY MMT:  MMT Right 01/06/22 Left 01/06/22:  Rt 01/27/22  Knee flexion     Knee extension     Ankle dorsiflexion 4/5 5/5 5/5  Ankle plantarflexion 4/5 5/5 5/5  Ankle inversion 3/5 5/5 4/5  Ankle eversion 3/5 5/5 3+/5   (Blank rows = not tested)   FUNCTIONAL TESTS:  01/06/22: 5 time sit to stand 20 seconds with no UE support 01/27/22: 5 time sit to stand: 10  seconds with no UE support  GAIT: 01/06/22:  Distance walked: 30 feet,  Assistive device utilized: None Level of assistance: Complete Independence Comments: mild antalgic gait     TODAY'S TREATMENT: 02/04/22 Therex:  nustep Level 5 x 3 min LE only Slantboard stretch: gastroc: x 2 holding 20 sec, soleus x 2 holding 20 sec Heel Raises/toe raises x 15 while squeezing golf ball between heels Seated DF lifting 5# KB off the floor x 15  Leg Press Calf raises bil LE's 37# 25 Attempting to pick up dowel x 10, unable to perform Rocking from inversion to eversion position with bil LE's with UE support x 10 Neuromuscular Re-edu Rocker board: DF/PF x 1 minute, Inversion/Eversion x 1 minute SLS: best time: 25 seconds no UE support Sliding disc: toward 3 cones placed anterior/lateral, mid/lateral and post/lateral x 10 c single UE support as needed Braiding front x 30 feet  Braiding front/back x 30 feet   01/27/22 Therex:  nustep Level 5 x 3 min LE only Slantboard stretch: gastroc: x 2 holding 20 sec, soleus x 2 holding 20 sec Heel Raises/toe raises x 20  Pushing off 6 inch step x 15 using Rt LE Leg Press: bil LE's 50# x 20  Leg Press Calf raises bil LE's 37# 3 x 10 Neuromuscular Re-edu BAPS board Level 2 x 2 minutes each direction standing c UE support SLS on Airex: Rt LE x 3 holding 30 seconds c UE finger tapping Sliding disc: toward 2 cones placed anterior/lateral and post/lateral x 10 c single UE support as needed Mini squats with UE support with towel roll under Rt midfoot    01/20/22 Therex: Bike Level 3 x 6 min Slantboard stretch 3x60 sec Heel Raises/toe raises x 20  Seated ankle 4-way 2 x 20 reps, L3 band BAPS board Level 3 x 2 minutes each direction sitting SLS on Airex: Rt LE x 3 holding 30 seconds Walking on Airex beam: x 8 with intermittent UE support in parallel bars        PATIENT EDUCATION:  Education details: PT POC, HEP Person educated: Patient Education  method: Consulting civil engineer, Media planner, Corporate treasurer cues, Verbal cues, and Handouts Education comprehension: verbalized understanding and returned demonstration   HOME EXERCISE PROGRAM: Access Code: [email or web address] URL: [email or web address] Date: 01/13/2022 Prepared by: Faustino Congress  Exercises - Seated Ankle Alphabet  - 2-3 x daily - 7 x weekly - 1 reps - Towel Scrunches  - 2-3 x daily - 7 x weekly - 5 reps - Heel Raises with Counter Support  - 2-3 x daily - 7 x weekly - 2 sets - 10 reps - Ankle Inversion Eversion Towel Slide  - 2-3 x daily - 7 x weekly - 2 sets - 10 reps - Standing Gastroc Stretch on Foam 1/2 Roll  -  2-3 x daily - 7 x weekly - 3 reps - 20-30 seconds hold - Seated Ankle Eversion with Resistance  - 1-2 x daily - 7 x weekly - 1-2 sets - 20 reps - Seated Ankle Dorsiflexion with Resistance  - 1-2 x daily - 7 x weekly - 1-2 sets - 20 reps - Seated Ankle Plantar Flexion with Resistance Loop  - 1-2 x daily - 7 x weekly - 1-2 sets - 20 reps - Seated Ankle Inversion with Resistance  - 1-2 x daily - 7 x weekly - 1-2 sets - 20 reps  ASSESSMENT:  CLINICAL IMPRESSION: Pt arriving today with no pain at rest. Pt still reporting weakness in her Rt ankle. Pt tolerating all exercise swell progressing with neuromuscular reeducation. Continue skilled PT to maximize pt's function.    OBJECTIVE IMPAIRMENTS decreased balance, difficulty walking, decreased ROM, decreased strength, increased edema, impaired flexibility, and pain.   ACTIVITY LIMITATIONS bending, standing, squatting, stairs, and transfers  PARTICIPATION LIMITATIONS: community activity  PERSONAL FACTORS Depression, GERD, hyperlipidemia, obesity, depression, sleep apnea, TIA are also affecting patient's functional outcome.   REHAB POTENTIAL: Good  CLINICAL DECISION MAKING: Stable/uncomplicated  EVALUATION COMPLEXITY: Low   GOALS: Goals reviewed with patient? Yes  SHORT TERM GOALS: Target date: 01/20/2022   Patient will demonstrate independent use of initial home exercise program to maintain progress from in clinic treatments. Goal status: MET 01/13/22    LONG TERM GOALS: Target date: 02/17/2022  Patient will demonstrate/report pain at worst less than or equal to 2/10 to facilitate minimal limitation in daily activity secondary to pain symptoms. Goal status: MET 01/27/22   Patient will demonstrate independent use of home exercise program to facilitate ability to maintain/progress functional gains from skilled physical therapy services. Goal status: New   Patient will demonstrate FOTO outcome > or = 63 % to indicate reduced disability due to condition. Goal status: New   Pt will be able to amb community distances for 20 minutes with no pain reported in Rt ankle.  Goal status: MET 01/27/22       5.  Pt will improve her Rt ankle strength to >/= 5/5 for improved functional mobility.   Goal status: On-going 01/27/22    PLAN: PT FREQUENCY: 1x/week  PT DURATION: 6 weeks  PLANNED INTERVENTIONS: Therapeutic exercises, Therapeutic activity, Neuromuscular re-education, Balance training, Gait training, Patient/Family education, Self Care, Joint mobilization, Dry Needling, Electrical stimulation, Cryotherapy, Taping, Vasopneumatic device, Ultrasound, and Manual therapy  PLAN FOR NEXT SESSION:  Balance Pods, standing balance (SLS activities) Inversion and Eversion strengthening     Kearney Hard, PT, MPT 02/04/22 2:40 PM   02/04/22 2:40 PM

## 2022-02-11 ENCOUNTER — Encounter: Payer: Self-pay | Admitting: Physical Therapy

## 2022-02-11 ENCOUNTER — Ambulatory Visit (INDEPENDENT_AMBULATORY_CARE_PROVIDER_SITE_OTHER): Payer: 59 | Admitting: Physical Therapy

## 2022-02-11 DIAGNOSIS — R262 Difficulty in walking, not elsewhere classified: Secondary | ICD-10-CM | POA: Diagnosis not present

## 2022-02-11 DIAGNOSIS — M6281 Muscle weakness (generalized): Secondary | ICD-10-CM | POA: Diagnosis not present

## 2022-02-11 DIAGNOSIS — M25571 Pain in right ankle and joints of right foot: Secondary | ICD-10-CM | POA: Diagnosis not present

## 2022-02-11 NOTE — Therapy (Signed)
OUTPATIENT PHYSICAL THERAPY TREATMENT NOTE Discharge   Patient Name: Whitney [name] MRN: [id number] DOB:July 09, 1955, 66 y.o., female Today's Date: 02/11/2022   END OF SESSION:   PT End of Session - 02/11/22 1340     Visit Number 6    Number of Visits 6    Date for PT Re-Evaluation 02/21/22    PT Start Time 8786    PT Stop Time 7672    PT Time Calculation (min) 32 min    Activity Tolerance Patient tolerated treatment well    Behavior During Therapy WFL for tasks assessed/performed                 Past Medical History:  Diagnosis Date   Allergy    Arthritis    Chest pain    Cough    Depression    GERD (gastroesophageal reflux disease)    Hayfever    Hyperlipidemia    IBS (irritable bowel syndrome)    Obesity    OSA (obstructive sleep apnea)    Pure hypercholesterolemia    Right leg pain    TIA (transient ischemic attack)    Past Surgical History:  Procedure Laterality Date   CHOLECYSTECTOMY     DILATION AND CURETTAGE OF UTERUS     FOOT SURGERY     Patient Active Problem List   Diagnosis Date Noted   Aortic atherosclerosis ([place]) 12/24/2021   Coronary artery calcification 12/24/2021   Constipation 08/06/2021   Cough 08/06/2021   Diverticular disease of colon 08/06/2021   Encounter for general adult medical examination without abnormal findings 08/06/2021   Irritable bowel syndrome with diarrhea 08/06/2021   Pain in right leg 08/06/2021   Pure hypercholesterolemia 08/06/2021   Hyperlipidemia 08/30/2014   GERD (gastroesophageal reflux disease) 08/30/2014   Morbid obesity ([place]) 08/30/2014   Obstructive sleep apnea 08/30/2014     THERAPY DIAG:  Pain in right ankle and joints of right foot  Muscle weakness (generalized)  Difficulty in walking, not elsewhere classified   PCP: Jani Gravel, MD  REFERRING PROVIDER:  Garrel Ridgel, [place]  REFERRING DIAG: [phone] (ICD-10-CM) - Achilles tendinitis of right lower extremity  Rationale for Evaluation  and Treatment Rehabilitation  ONSET DATE: Rt ankle surgery June 14, 2021  SUBJECTIVE:   SUBJECTIVE STATEMENT: Pt arriving to therapy reporting no pain.    PERTINENT HISTORY: She presents today date of surgery June 14, 2021 right foot gastroc recession Achilles tendon repair heel spur resection and a total endoscopic plantar fasciotomy with exostectomy.   Depression, GERD, hyperlipidemia, obesity, depression, sleep apnea, TIA  PAIN:  Are you having pain? no Pain location: Rt ankle, posterior heel Pain description: sore, stiffness Aggravating factors: walking longer distances, standing prolonged Relieving factors: resting  PRECAUTIONS: None  WEIGHT BEARING RESTRICTIONS No  FALLS:  Has patient fallen in last 6 months? No  LIVING ENVIRONMENT: Lives with: lives with their family and lives with their spouse Lives in: House/apartment StairsYes: External: 5 steps; bilateral but cannot reach both:  Has following equipment at home: None  OCCUPATION: retired, RT  PLOF: Independent  PATIENT GOALS:  improve pain   OBJECTIVE:   PATIENT SURVEYS:  01/06/22: FOTO 55%, (predicted 63%) 02/11/22: FOTO   70  %, (predicted 63%)  PALPATION: 01/06/22: TTP along incision sites, posterior and distal achilles  LOWER EXTREMITY ROM:  Active ROM Right 01/06/22 Left 01/06/22 Rt 01/27/22 A: active P: passive Rt 02/04/22 Rt 02/11/22  Knee flexion       Knee extension  Ankle dorsiflexion 5 10 A: 10 P: 14 A: 10  A: 12 P: 18  Ankle plantarflexion 35 55 A: 48 P: 60 A: 48 A: 48 P: 50  Ankle inversion 22 27 A: 28 P: 40 A: 28 P: 42 A: 28  Ankle eversion 22 30 A: 26 P: 48 A: 26 P: 40 A: 35   (Blank rows = not tested)  LOWER EXTREMITY MMT:  MMT Right 01/06/22 Left 01/06/22:  Rt 01/27/22 Rt 02/11/22  Knee flexion      Knee extension      Ankle dorsiflexion 4/5 5/5 5/5 5/5  Ankle plantarflexion 4/5 5/5 5/5 5/5  Ankle inversion 3/5 5/5 4/5 4/5  Ankle eversion 3/5 5/5 3+/5 4/5    (Blank rows = not tested)   FUNCTIONAL TESTS:  01/06/22: 5 time sit to stand 20 seconds with no UE support 01/27/22: 5 time sit to stand: 10 seconds with no UE support 02/11/22: 5 time sit to stand 7 seconds with no UE support  GAIT: 01/06/22:  Distance walked: 30 feet,  Assistive device utilized: None Level of assistance: Complete Independence Comments: mild antalgic gait  02/11/22:  Pt amb with step over step gait pattern with push off noted in Rt ankle     TODAY'S TREATMENT: 02/11/22 Therex:  nustep Level 5 x 3 min LE only Slantboard stretch: gastroc: x 2 holding 20 sec, soleus x 2 holding 20 sec Heel Raises/toe raises x 10 Attempted single Rt LE calf raises c bil UE support with decreased lift off Leg Press Calf raises bil LE's 37# 25 Wash cloth pick up x 5 with Rt toes  Neuromuscular Re-edu Toe walking and heel walking x 20 feet  02/04/22 Therex:  nustep Level 5 x 3 min LE only Slantboard stretch: gastroc: x 2 holding 20 sec, soleus x 2 holding 20 sec Heel Raises/toe raises x 15 while squeezing golf ball between heels Seated DF lifting 5# KB off the floor x 15  Leg Press Calf raises bil LE's 37# 25 Attempting to pick up dowel x 10, unable to perform Rocking from inversion to eversion position with bil LE's with UE support x 10 Neuromuscular Re-edu Rocker board: DF/PF x 1 minute, Inversion/Eversion x 1 minute SLS: best time: 25 seconds no UE support Sliding disc: toward 3 cones placed anterior/lateral, mid/lateral and post/lateral x 10 c single UE support as needed Braiding front x 30 feet  Braiding front/back x 30 feet   01/27/22 Therex:  nustep Level 5 x 3 min LE only Slantboard stretch: gastroc: x 2 holding 20 sec, soleus x 2 holding 20 sec Heel Raises/toe raises x 20  Pushing off 6 inch step x 15 using Rt LE Leg Press: bil LE's 50# x 20  Leg Press Calf raises bil LE's 37# 3 x 10 Neuromuscular Re-edu BAPS board Level 2 x 2 minutes each direction standing c UE  support SLS on Airex: Rt LE x 3 holding 30 seconds c UE finger tapping Sliding disc: toward 2 cones placed anterior/lateral and post/lateral x 10 c single UE support as needed Mini squats with UE support with towel roll under Rt midfoot    01/20/22 Therex: Bike Level 3 x 6 min Slantboard stretch 3x60 sec Heel Raises/toe raises x 20  Seated ankle 4-way 2 x 20 reps, L3 band BAPS board Level 3 x 2 minutes each direction sitting SLS on Airex: Rt LE x 3 holding 30 seconds Walking on Airex beam: x 8 with intermittent UE support in  parallel bars        PATIENT EDUCATION: 02/11/22 Education details: HEP updated and reviewed Person educated: Patient Education method: Explanation, Demonstration, Tactile cues, Verbal cues, and Handouts Education comprehension: verbalized understanding and returned demonstration   HOME EXERCISE PROGRAM: Access Code: [email or web address] URL: [email or web address] Date: 02/11/2022 Prepared by: Kearney Hard  Exercises - Seated Ankle Alphabet  - 2 x daily - 7 x weekly - 1 reps - Towel Scrunches  - 1-2 x daily - 7 x weekly - 5 reps - Heel Raises with Counter Support  - 1-2 x daily - 7 x weekly - 2 sets - 10 reps - Toe Walking  - 1 x daily - 7 x weekly - 3 sets - 10 reps - Single Leg Heel Raise with Chair Support  - 1 x daily - 7 x weekly - 3 sets - 10 reps - Standing Gastroc Stretch on Foam 1/2 Roll  - 2-3 x daily - 7 x weekly - 3 reps - 20-30 seconds hold - Seated Ankle Eversion with Resistance  - 1-2 x daily - 7 x weekly - 1-2 sets - 20 reps - Seated Ankle Dorsiflexion with Resistance  - 1-2 x daily - 7 x weekly - 1-2 sets - 20 reps - Seated Ankle Plantar Flexion with Resistance Loop  - 1-2 x daily - 7 x weekly - 1-2 sets - 20 reps - Seated Ankle Inversion with Resistance  - 1-2 x daily - 7 x weekly - 1-2 sets - 20 reps  ASSESSMENT:  CLINICAL IMPRESSION: Pt arriving with no pain at rest. Pt made excellent progress with therapy. Pt has met all  STG's and 4 out of 5 LTG's set at evaluation. Pt still progressing with inversion and eversion strength which she can do on her own with her updated HEP. Pt was issued blue theraband for home progression with resisted ankle 4 way exercises.Pt's Active and passive ankle ROM is WNL.  Pt being discharged today from skilled PT.    OBJECTIVE IMPAIRMENTS decreased balance, difficulty walking, decreased ROM, decreased strength, increased edema, impaired flexibility, and pain.   ACTIVITY LIMITATIONS bending, standing, squatting, stairs, and transfers  PARTICIPATION LIMITATIONS: community activity  PERSONAL FACTORS Depression, GERD, hyperlipidemia, obesity, depression, sleep apnea, TIA are also affecting patient's functional outcome.   REHAB POTENTIAL: Good  CLINICAL DECISION MAKING: Stable/uncomplicated  EVALUATION COMPLEXITY: Low   GOALS: Goals reviewed with patient? Yes  SHORT TERM GOALS: Target date: 01/20/2022  Patient will demonstrate independent use of initial home exercise program to maintain progress from in clinic treatments. Goal status: MET 01/13/22    LONG TERM GOALS: Target date: 02/17/2022  Patient will demonstrate/report pain at worst less than or equal to 2/10 to facilitate minimal limitation in daily activity secondary to pain symptoms. Goal status: MET 01/27/22   Patient will demonstrate independent use of home exercise program to facilitate ability to maintain/progress functional gains from skilled physical therapy services. Goal status: MET 02/11/22   Patient will demonstrate FOTO outcome > or = 63 % to indicate reduced disability due to condition. Goal status: MET 02/11/22    Pt will be able to amb community distances for 20 minutes with no pain reported in Rt ankle.  Goal status: MET 01/27/22       5.  Pt will improve her Rt ankle strength to >/= 5/5 for improved functional mobility.   Goal status:  02/11/22 Partially MET    PLAN: PT FREQUENCY: 1x/week  PT  DURATION: 6 weeks  PLANNED  INTERVENTIONS: Therapeutic exercises, Therapeutic activity, Neuromuscular re-education, Balance training, Gait training, Patient/Family education, Self Care, Joint mobilization, Dry Needling, Electrical stimulation, Cryotherapy, Taping, Vasopneumatic device, Ultrasound, and Manual therapy  PLAN FOR NEXT SESSION:  Discharge     Kearney Hard, PT, MPT 02/11/22 2:29 PM   02/11/22 2:29 PM  PHYSICAL THERAPY DISCHARGE SUMMARY  Visits from Start of Care: 6 of 6  Current functional level related to goals / functional outcomes: See above   Remaining deficits: See above   Education / Equipment: HEP   Patient agrees to discharge. Patient goals were met. Patient is being discharged due to meeting the stated rehab goals.

## 2022-12-08 ENCOUNTER — Other Ambulatory Visit: Payer: Self-pay | Admitting: Internal Medicine

## 2023-01-06 ENCOUNTER — Other Ambulatory Visit: Payer: Self-pay | Admitting: Internal Medicine

## 2023-02-09 ENCOUNTER — Other Ambulatory Visit: Payer: Self-pay | Admitting: Internal Medicine

## 2023-02-25 ENCOUNTER — Other Ambulatory Visit: Payer: Self-pay | Admitting: Internal Medicine

## 2023-03-12 ENCOUNTER — Other Ambulatory Visit: Payer: Self-pay | Admitting: Internal Medicine

## 2023-04-13 ENCOUNTER — Other Ambulatory Visit: Payer: Self-pay | Admitting: Internal Medicine
